# Patient Record
Sex: Female | Born: 1960 | Race: White | Hispanic: No | Marital: Married | Smoking: Never smoker
Health system: Southern US, Community
[De-identification: ages and names within clinical notes are randomized; demographics above are authoritative.]

## PROBLEM LIST (undated history)

## (undated) DIAGNOSIS — D214 Benign neoplasm of connective and other soft tissue of abdomen: Secondary | ICD-10-CM

## (undated) DIAGNOSIS — Z87442 Personal history of urinary calculi: Secondary | ICD-10-CM

## (undated) DIAGNOSIS — K219 Gastro-esophageal reflux disease without esophagitis: Secondary | ICD-10-CM

## (undated) DIAGNOSIS — G2581 Restless legs syndrome: Secondary | ICD-10-CM

## (undated) DIAGNOSIS — M1990 Unspecified osteoarthritis, unspecified site: Secondary | ICD-10-CM

## (undated) DIAGNOSIS — IMO0002 Reserved for concepts with insufficient information to code with codable children: Secondary | ICD-10-CM

## (undated) DIAGNOSIS — M797 Fibromyalgia: Secondary | ICD-10-CM

## (undated) DIAGNOSIS — E785 Hyperlipidemia, unspecified: Secondary | ICD-10-CM

## (undated) DIAGNOSIS — T7840XA Allergy, unspecified, initial encounter: Secondary | ICD-10-CM

## (undated) DIAGNOSIS — M5430 Sciatica, unspecified side: Secondary | ICD-10-CM

## (undated) DIAGNOSIS — M51369 Other intervertebral disc degeneration, lumbar region without mention of lumbar back pain or lower extremity pain: Secondary | ICD-10-CM

## (undated) DIAGNOSIS — F32A Depression, unspecified: Secondary | ICD-10-CM

## (undated) DIAGNOSIS — M5136 Other intervertebral disc degeneration, lumbar region: Secondary | ICD-10-CM

## (undated) DIAGNOSIS — F329 Major depressive disorder, single episode, unspecified: Secondary | ICD-10-CM

## (undated) DIAGNOSIS — K589 Irritable bowel syndrome without diarrhea: Secondary | ICD-10-CM

## (undated) DIAGNOSIS — F419 Anxiety disorder, unspecified: Secondary | ICD-10-CM

## (undated) HISTORY — DX: Other intervertebral disc degeneration, lumbar region: M51.36

## (undated) HISTORY — DX: Hyperlipidemia, unspecified: E78.5

## (undated) HISTORY — DX: Irritable bowel syndrome, unspecified: K58.9

## (undated) HISTORY — DX: Allergy, unspecified, initial encounter: T78.40XA

## (undated) HISTORY — DX: Other intervertebral disc degeneration, lumbar region without mention of lumbar back pain or lower extremity pain: M51.369

## (undated) HISTORY — DX: Personal history of urinary calculi: Z87.442

## (undated) HISTORY — PX: TONSILLECTOMY: SUR1361

## (undated) HISTORY — DX: Gastro-esophageal reflux disease without esophagitis: K21.9

## (undated) HISTORY — PX: MASTECTOMY: SHX3

## (undated) HISTORY — PX: OTHER SURGICAL HISTORY: SHX169

## (undated) HISTORY — DX: Fibromyalgia: M79.7

## (undated) HISTORY — DX: Benign neoplasm of connective and other soft tissue of abdomen: D21.4

## (undated) HISTORY — PX: LEFT OOPHORECTOMY: SHX1961

## (undated) HISTORY — DX: Restless legs syndrome: G25.81

## (undated) HISTORY — DX: Reserved for concepts with insufficient information to code with codable children: IMO0002

## (undated) HISTORY — DX: Sciatica, unspecified side: M54.30

## (undated) HISTORY — DX: Depression, unspecified: F32.A

## (undated) HISTORY — PX: TUBAL LIGATION: SHX77

## (undated) HISTORY — DX: Anxiety disorder, unspecified: F41.9

## (undated) HISTORY — PX: ABDOMINAL HYSTERECTOMY: SHX81

## (undated) HISTORY — DX: Major depressive disorder, single episode, unspecified: F32.9

## (undated) HISTORY — DX: Unspecified osteoarthritis, unspecified site: M19.90

---

## 1998-06-27 ENCOUNTER — Ambulatory Visit (HOSPITAL_COMMUNITY): Admission: RE | Admit: 1998-06-27 | Discharge: 1998-06-27 | Payer: Self-pay | Admitting: Gastroenterology

## 1999-04-07 ENCOUNTER — Encounter: Payer: Self-pay | Admitting: Internal Medicine

## 1999-04-07 ENCOUNTER — Ambulatory Visit (HOSPITAL_COMMUNITY): Admission: RE | Admit: 1999-04-07 | Discharge: 1999-04-07 | Payer: Self-pay | Admitting: Internal Medicine

## 1999-04-15 ENCOUNTER — Ambulatory Visit: Admission: RE | Admit: 1999-04-15 | Discharge: 1999-04-15 | Payer: Self-pay | Admitting: Internal Medicine

## 1999-09-29 ENCOUNTER — Observation Stay (HOSPITAL_COMMUNITY): Admission: EM | Admit: 1999-09-29 | Discharge: 1999-09-30 | Payer: Self-pay | Admitting: Emergency Medicine

## 1999-09-29 ENCOUNTER — Encounter: Payer: Self-pay | Admitting: Emergency Medicine

## 1999-10-06 ENCOUNTER — Other Ambulatory Visit: Admission: RE | Admit: 1999-10-06 | Discharge: 1999-10-06 | Payer: Self-pay | Admitting: Obstetrics and Gynecology

## 2000-02-22 ENCOUNTER — Emergency Department (HOSPITAL_COMMUNITY): Admission: EM | Admit: 2000-02-22 | Discharge: 2000-02-22 | Payer: Self-pay | Admitting: Emergency Medicine

## 2000-02-22 ENCOUNTER — Encounter: Payer: Self-pay | Admitting: Emergency Medicine

## 2000-03-02 ENCOUNTER — Ambulatory Visit (HOSPITAL_COMMUNITY): Admission: RE | Admit: 2000-03-02 | Discharge: 2000-03-02 | Payer: Self-pay | Admitting: Gastroenterology

## 2000-03-15 ENCOUNTER — Encounter: Payer: Self-pay | Admitting: Gastroenterology

## 2000-03-15 ENCOUNTER — Ambulatory Visit (HOSPITAL_COMMUNITY): Admission: RE | Admit: 2000-03-15 | Discharge: 2000-03-15 | Payer: Self-pay | Admitting: Gastroenterology

## 2000-06-08 ENCOUNTER — Encounter: Payer: Self-pay | Admitting: Gastroenterology

## 2000-06-08 ENCOUNTER — Ambulatory Visit (HOSPITAL_COMMUNITY): Admission: RE | Admit: 2000-06-08 | Discharge: 2000-06-08 | Payer: Self-pay | Admitting: Gastroenterology

## 2001-02-01 ENCOUNTER — Other Ambulatory Visit: Admission: RE | Admit: 2001-02-01 | Discharge: 2001-02-01 | Payer: Self-pay | Admitting: Obstetrics and Gynecology

## 2002-01-28 ENCOUNTER — Emergency Department (HOSPITAL_COMMUNITY): Admission: EM | Admit: 2002-01-28 | Discharge: 2002-01-28 | Payer: Self-pay | Admitting: Emergency Medicine

## 2002-01-28 ENCOUNTER — Encounter: Payer: Self-pay | Admitting: Emergency Medicine

## 2002-02-06 ENCOUNTER — Other Ambulatory Visit: Admission: RE | Admit: 2002-02-06 | Discharge: 2002-02-06 | Payer: Self-pay | Admitting: Obstetrics and Gynecology

## 2003-07-09 ENCOUNTER — Ambulatory Visit (HOSPITAL_COMMUNITY): Admission: RE | Admit: 2003-07-09 | Discharge: 2003-07-09 | Payer: Self-pay | Admitting: Gastroenterology

## 2003-07-10 ENCOUNTER — Other Ambulatory Visit: Admission: RE | Admit: 2003-07-10 | Discharge: 2003-07-10 | Payer: Self-pay | Admitting: Obstetrics and Gynecology

## 2003-07-11 ENCOUNTER — Ambulatory Visit (HOSPITAL_BASED_OUTPATIENT_CLINIC_OR_DEPARTMENT_OTHER): Admission: RE | Admit: 2003-07-11 | Discharge: 2003-07-11 | Payer: Self-pay | Admitting: Internal Medicine

## 2005-06-22 ENCOUNTER — Other Ambulatory Visit: Admission: RE | Admit: 2005-06-22 | Discharge: 2005-06-22 | Payer: Self-pay | Admitting: Obstetrics and Gynecology

## 2005-10-25 ENCOUNTER — Ambulatory Visit: Payer: Self-pay | Admitting: Internal Medicine

## 2005-10-27 ENCOUNTER — Ambulatory Visit: Payer: Self-pay | Admitting: Internal Medicine

## 2007-11-30 LAB — CONVERTED CEMR LAB: Pap Smear: NORMAL

## 2007-12-18 ENCOUNTER — Encounter: Admission: RE | Admit: 2007-12-18 | Discharge: 2007-12-18 | Payer: Self-pay | Admitting: Obstetrics and Gynecology

## 2007-12-19 ENCOUNTER — Other Ambulatory Visit: Admission: RE | Admit: 2007-12-19 | Discharge: 2007-12-19 | Payer: Self-pay | Admitting: Obstetrics and Gynecology

## 2008-04-26 ENCOUNTER — Encounter: Payer: Self-pay | Admitting: Endocrinology

## 2008-08-06 ENCOUNTER — Ambulatory Visit: Payer: Self-pay | Admitting: Internal Medicine

## 2008-08-06 DIAGNOSIS — K589 Irritable bowel syndrome without diarrhea: Secondary | ICD-10-CM | POA: Insufficient documentation

## 2008-08-06 DIAGNOSIS — M797 Fibromyalgia: Secondary | ICD-10-CM | POA: Insufficient documentation

## 2008-08-06 DIAGNOSIS — G4733 Obstructive sleep apnea (adult) (pediatric): Secondary | ICD-10-CM | POA: Insufficient documentation

## 2008-08-06 DIAGNOSIS — J309 Allergic rhinitis, unspecified: Secondary | ICD-10-CM | POA: Insufficient documentation

## 2008-08-06 DIAGNOSIS — G2581 Restless legs syndrome: Secondary | ICD-10-CM | POA: Insufficient documentation

## 2008-08-06 DIAGNOSIS — Z87442 Personal history of urinary calculi: Secondary | ICD-10-CM | POA: Insufficient documentation

## 2008-08-06 DIAGNOSIS — N390 Urinary tract infection, site not specified: Secondary | ICD-10-CM | POA: Insufficient documentation

## 2008-08-06 DIAGNOSIS — F329 Major depressive disorder, single episode, unspecified: Secondary | ICD-10-CM | POA: Insufficient documentation

## 2008-08-06 DIAGNOSIS — K219 Gastro-esophageal reflux disease without esophagitis: Secondary | ICD-10-CM | POA: Insufficient documentation

## 2008-08-06 DIAGNOSIS — F411 Generalized anxiety disorder: Secondary | ICD-10-CM | POA: Insufficient documentation

## 2008-08-06 DIAGNOSIS — E785 Hyperlipidemia, unspecified: Secondary | ICD-10-CM | POA: Insufficient documentation

## 2008-08-06 LAB — CONVERTED CEMR LAB
ALT: 28 units/L (ref 0–35)
AST: 24 units/L (ref 0–37)
Albumin: 4.1 g/dL (ref 3.5–5.2)
Alkaline Phosphatase: 66 units/L (ref 39–117)
BUN: 17 mg/dL (ref 6–23)
Basophils Absolute: 0 10*3/uL (ref 0.0–0.1)
Basophils Relative: 0.6 % (ref 0.0–3.0)
Bilirubin Urine: NEGATIVE
Bilirubin, Direct: 0.1 mg/dL (ref 0.0–0.3)
CO2: 31 meq/L (ref 19–32)
Calcium: 9.6 mg/dL (ref 8.4–10.5)
Chloride: 103 meq/L (ref 96–112)
Cholesterol: 249 mg/dL (ref 0–200)
Creatinine, Ser: 0.6 mg/dL (ref 0.4–1.2)
Crystals: NEGATIVE
Direct LDL: 149.6 mg/dL
Eosinophils Absolute: 0.1 10*3/uL (ref 0.0–0.7)
Eosinophils Relative: 1.5 % (ref 0.0–5.0)
GFR calc Af Amer: 137 mL/min
GFR calc non Af Amer: 113 mL/min
Glucose, Bld: 101 mg/dL — ABNORMAL HIGH (ref 70–99)
HCT: 37.7 % (ref 36.0–46.0)
HDL: 36.9 mg/dL — ABNORMAL LOW (ref >39.0)
Hemoglobin: 13.3 g/dL (ref 12.0–15.0)
Lymphocytes Relative: 38.1 % (ref 12.0–46.0)
MCHC: 35.3 g/dL (ref 30.0–36.0)
MCV: 91 fL (ref 78.0–100.0)
Monocytes Absolute: 0.8 10*3/uL (ref 0.1–1.0)
Monocytes Relative: 9.4 % (ref 3.0–12.0)
Mucus, UA: NEGATIVE
Neutro Abs: 4.1 10*3/uL (ref 1.4–7.7)
Neutrophils Relative %: 50.4 % (ref 43.0–77.0)
Nitrite: POSITIVE — AB
Platelets: 275 10*3/uL (ref 150–400)
Potassium: 3.5 meq/L (ref 3.5–5.1)
RBC: 4.14 M/uL (ref 3.87–5.11)
RDW: 11.8 % (ref 11.5–14.6)
Sodium: 142 meq/L (ref 135–145)
Specific Gravity, Urine: 1.02 (ref 1.000–1.03)
TSH: 0.76 microintl units/mL (ref 0.35–5.50)
Total Bilirubin: 0.8 mg/dL (ref 0.3–1.2)
Total CHOL/HDL Ratio: 6.7
Total Protein, Urine: 100 mg/dL — AB
Total Protein: 7.2 g/dL (ref 6.0–8.3)
Triglycerides: 218 mg/dL (ref 0–149)
Urine Glucose: 100 mg/dL — AB
Urobilinogen, UA: 4 — AB (ref 0.0–1.0)
VLDL: 44 mg/dL — ABNORMAL HIGH (ref 0–40)
WBC: 8 10*3/uL (ref 4.5–10.5)
pH: 6.5 (ref 5.0–8.0)

## 2008-08-08 LAB — CONVERTED CEMR LAB: Vit D, 1,25-Dihydroxy: 22 — ABNORMAL LOW (ref 30–89)

## 2008-09-10 ENCOUNTER — Encounter: Payer: Self-pay | Admitting: Internal Medicine

## 2008-10-16 ENCOUNTER — Encounter: Payer: Self-pay | Admitting: Internal Medicine

## 2009-03-18 ENCOUNTER — Encounter: Payer: Self-pay | Admitting: Internal Medicine

## 2009-05-05 ENCOUNTER — Telehealth (INDEPENDENT_AMBULATORY_CARE_PROVIDER_SITE_OTHER): Payer: Self-pay | Admitting: *Deleted

## 2009-08-20 ENCOUNTER — Ambulatory Visit: Payer: Self-pay | Admitting: Internal Medicine

## 2009-08-20 DIAGNOSIS — J329 Chronic sinusitis, unspecified: Secondary | ICD-10-CM | POA: Insufficient documentation

## 2009-08-21 LAB — CONVERTED CEMR LAB
ALT: 52 units/L — ABNORMAL HIGH (ref 0–35)
AST: 36 units/L (ref 0–37)
Albumin: 4.1 g/dL (ref 3.5–5.2)
Alkaline Phosphatase: 68 units/L (ref 39–117)
BUN: 14 mg/dL (ref 6–23)
Basophils Absolute: 0 10*3/uL (ref 0.0–0.1)
Basophils Relative: 0.4 % (ref 0.0–3.0)
Bilirubin Urine: NEGATIVE
Bilirubin, Direct: 0.1 mg/dL (ref 0.0–0.3)
CO2: 30 meq/L (ref 19–32)
Calcium: 9.3 mg/dL (ref 8.4–10.5)
Chloride: 107 meq/L (ref 96–112)
Cholesterol: 212 mg/dL — ABNORMAL HIGH (ref 0–200)
Creatinine, Ser: 0.6 mg/dL (ref 0.4–1.2)
Direct LDL: 149.9 mg/dL
Eosinophils Absolute: 0.2 10*3/uL (ref 0.0–0.7)
Eosinophils Relative: 2.8 % (ref 0.0–5.0)
GFR calc non Af Amer: 112.82 mL/min (ref >60)
Glucose, Bld: 95 mg/dL (ref 70–99)
HCT: 38.8 % (ref 36.0–46.0)
HDL: 42.1 mg/dL (ref >39.00)
Hemoglobin: 13.5 g/dL (ref 12.0–15.0)
Ketones, ur: NEGATIVE mg/dL
Leukocytes, UA: NEGATIVE
Lymphocytes Relative: 38.3 % (ref 12.0–46.0)
Lymphs Abs: 2.7 10*3/uL (ref 0.7–4.0)
MCHC: 34.8 g/dL (ref 30.0–36.0)
MCV: 90.8 fL (ref 78.0–100.0)
Monocytes Absolute: 0.8 10*3/uL (ref 0.1–1.0)
Monocytes Relative: 10.7 % (ref 3.0–12.0)
Neutro Abs: 3.4 10*3/uL (ref 1.4–7.7)
Neutrophils Relative %: 47.8 % (ref 43.0–77.0)
Nitrite: NEGATIVE
Platelets: 241 10*3/uL (ref 150.0–400.0)
Potassium: 3.9 meq/L (ref 3.5–5.1)
RBC: 4.27 M/uL (ref 3.87–5.11)
RDW: 11.6 % (ref 11.5–14.6)
Sodium: 141 meq/L (ref 135–145)
Specific Gravity, Urine: 1.02 (ref 1.000–1.030)
TSH: 0.54 microintl units/mL (ref 0.35–5.50)
Total Bilirubin: 0.7 mg/dL (ref 0.3–1.2)
Total CHOL/HDL Ratio: 5
Total Protein, Urine: NEGATIVE mg/dL
Total Protein: 7.5 g/dL (ref 6.0–8.3)
Triglycerides: 173 mg/dL — ABNORMAL HIGH (ref 0.0–149.0)
Urine Glucose: NEGATIVE mg/dL
Urobilinogen, UA: 0.2 (ref 0.0–1.0)
VLDL: 34.6 mg/dL (ref 0.0–40.0)
WBC: 7.1 10*3/uL (ref 4.5–10.5)
pH: 6.5 (ref 5.0–8.0)

## 2010-01-28 ENCOUNTER — Encounter: Payer: Self-pay | Admitting: Internal Medicine

## 2010-02-23 ENCOUNTER — Ambulatory Visit: Payer: Self-pay | Admitting: Internal Medicine

## 2010-02-23 DIAGNOSIS — J019 Acute sinusitis, unspecified: Secondary | ICD-10-CM | POA: Insufficient documentation

## 2010-03-16 ENCOUNTER — Ambulatory Visit: Payer: Self-pay | Admitting: Internal Medicine

## 2010-03-16 DIAGNOSIS — R197 Diarrhea, unspecified: Secondary | ICD-10-CM | POA: Insufficient documentation

## 2010-03-16 DIAGNOSIS — R109 Unspecified abdominal pain: Secondary | ICD-10-CM | POA: Insufficient documentation

## 2010-03-16 LAB — CONVERTED CEMR LAB
Basophils Absolute: 0 10*3/uL (ref 0.0–0.1)
Basophils Relative: 0.3 % (ref 0.0–3.0)
Bilirubin Urine: NEGATIVE
Eosinophils Absolute: 0 10*3/uL (ref 0.0–0.7)
Eosinophils Relative: 0.7 % (ref 0.0–5.0)
HCT: 37.7 % (ref 36.0–46.0)
Hemoglobin: 13 g/dL (ref 12.0–15.0)
Ketones, ur: NEGATIVE mg/dL
Leukocytes, UA: NEGATIVE
Lymphocytes Relative: 34.3 % (ref 12.0–46.0)
Lymphs Abs: 2.1 10*3/uL (ref 0.7–4.0)
MCHC: 34.5 g/dL (ref 30.0–36.0)
MCV: 90.5 fL (ref 78.0–100.0)
Monocytes Absolute: 0.6 10*3/uL (ref 0.1–1.0)
Monocytes Relative: 9.3 % (ref 3.0–12.0)
Neutro Abs: 3.3 10*3/uL (ref 1.4–7.7)
Neutrophils Relative %: 55.4 % (ref 43.0–77.0)
Nitrite: NEGATIVE
Platelets: 216 10*3/uL (ref 150.0–400.0)
RBC: 4.16 M/uL (ref 3.87–5.11)
RDW: 12.4 % (ref 11.5–14.6)
Specific Gravity, Urine: 1.015 (ref 1.000–1.030)
Total Protein, Urine: NEGATIVE mg/dL
Urine Glucose: NEGATIVE mg/dL
Urobilinogen, UA: 0.2 (ref 0.0–1.0)
WBC: 6 10*3/uL (ref 4.5–10.5)
pH: 6 (ref 5.0–8.0)

## 2010-03-17 ENCOUNTER — Encounter: Payer: Self-pay | Admitting: Internal Medicine

## 2010-04-01 ENCOUNTER — Emergency Department (HOSPITAL_BASED_OUTPATIENT_CLINIC_OR_DEPARTMENT_OTHER): Admission: EM | Admit: 2010-04-01 | Discharge: 2010-04-01 | Payer: Self-pay | Admitting: Emergency Medicine

## 2010-04-01 ENCOUNTER — Ambulatory Visit: Payer: Self-pay | Admitting: Diagnostic Radiology

## 2010-04-20 ENCOUNTER — Emergency Department (HOSPITAL_COMMUNITY): Admission: EM | Admit: 2010-04-20 | Discharge: 2010-04-20 | Payer: Self-pay | Admitting: Emergency Medicine

## 2010-04-29 ENCOUNTER — Encounter: Payer: Self-pay | Admitting: Internal Medicine

## 2010-08-11 ENCOUNTER — Ambulatory Visit: Payer: Self-pay | Admitting: Internal Medicine

## 2010-08-25 ENCOUNTER — Encounter: Payer: Self-pay | Admitting: Internal Medicine

## 2010-08-25 ENCOUNTER — Encounter (INDEPENDENT_AMBULATORY_CARE_PROVIDER_SITE_OTHER): Payer: Self-pay | Admitting: *Deleted

## 2010-08-25 ENCOUNTER — Ambulatory Visit: Payer: Self-pay | Admitting: Internal Medicine

## 2010-08-25 DIAGNOSIS — R21 Rash and other nonspecific skin eruption: Secondary | ICD-10-CM | POA: Insufficient documentation

## 2010-08-26 LAB — CONVERTED CEMR LAB
ALT: 29 units/L (ref 0–35)
AST: 24 units/L (ref 0–37)
Albumin: 4.1 g/dL (ref 3.5–5.2)
Alkaline Phosphatase: 78 units/L (ref 39–117)
BUN: 19 mg/dL (ref 6–23)
Basophils Absolute: 0 10*3/uL (ref 0.0–0.1)
Basophils Relative: 0.5 % (ref 0.0–3.0)
Bilirubin Urine: NEGATIVE
Bilirubin, Direct: 0.1 mg/dL (ref 0.0–0.3)
CO2: 30 meq/L (ref 19–32)
Calcium: 9.1 mg/dL (ref 8.4–10.5)
Chloride: 108 meq/L (ref 96–112)
Cholesterol: 221 mg/dL — ABNORMAL HIGH (ref 0–200)
Creatinine, Ser: 0.6 mg/dL (ref 0.4–1.2)
Direct LDL: 154.5 mg/dL
Eosinophils Absolute: 0.1 10*3/uL (ref 0.0–0.7)
Eosinophils Relative: 2 % (ref 0.0–5.0)
GFR calc non Af Amer: 119.2 mL/min (ref >60)
Glucose, Bld: 87 mg/dL (ref 70–99)
HCT: 35.8 % — ABNORMAL LOW (ref 36.0–46.0)
HDL: 51.3 mg/dL (ref >39.00)
Hemoglobin: 12.3 g/dL (ref 12.0–15.0)
Ketones, ur: NEGATIVE mg/dL
Lymphocytes Relative: 42.1 % (ref 12.0–46.0)
Lymphs Abs: 2.7 10*3/uL (ref 0.7–4.0)
MCHC: 34.2 g/dL (ref 30.0–36.0)
MCV: 90.9 fL (ref 78.0–100.0)
Monocytes Absolute: 0.5 10*3/uL (ref 0.1–1.0)
Monocytes Relative: 7 % (ref 3.0–12.0)
Neutro Abs: 3.1 10*3/uL (ref 1.4–7.7)
Neutrophils Relative %: 48.4 % (ref 43.0–77.0)
Nitrite: NEGATIVE
Platelets: 243 10*3/uL (ref 150.0–400.0)
Potassium: 4.6 meq/L (ref 3.5–5.1)
RBC: 3.94 M/uL (ref 3.87–5.11)
RDW: 13 % (ref 11.5–14.6)
Sodium: 143 meq/L (ref 135–145)
Specific Gravity, Urine: 1.02 (ref 1.000–1.030)
TSH: 0.8 microintl units/mL (ref 0.35–5.50)
Total Bilirubin: 0.4 mg/dL (ref 0.3–1.2)
Total CHOL/HDL Ratio: 4
Total Protein, Urine: NEGATIVE mg/dL
Total Protein: 7.2 g/dL (ref 6.0–8.3)
Triglycerides: 58 mg/dL (ref 0.0–149.0)
Urine Glucose: NEGATIVE mg/dL
Urobilinogen, UA: 0.2 (ref 0.0–1.0)
VLDL: 11.6 mg/dL (ref 0.0–40.0)
WBC: 6.4 10*3/uL (ref 4.5–10.5)
pH: 7 (ref 5.0–8.0)

## 2010-09-23 ENCOUNTER — Ambulatory Visit: Payer: Self-pay | Admitting: Internal Medicine

## 2010-12-31 NOTE — Letter (Signed)
Summary: [facility] [place] [facility]  [facility] [place] [facility]   Imported By: Lennie Odor 08/27/2010 10:38:35  _____________________________________________________________________  External Attachment:    Type:   Image     Comment:   External Document

## 2010-12-31 NOTE — Assessment & Plan Note (Signed)
Summary: CPX /[facility]  #   Vital Signs:  Patient profile:   50 year old female Height:      63.5 inches Weight:      207.13 pounds BMI:     36.25 O2 Sat:      99 % on Room air Temp:     97.3 degrees F oral Pulse rate:   79 / minute BP sitting:   100 / 70  (left arm) Cuff size:   large  Vitals Entered By: Zella Ball Ewing CMA Duncan Dull) (August 25, 2010 1:16 PM)  O2 Flow:  Room air  Preventive Care Screening  Colonoscopy:    Date:  04/29/2010    Next Due:  04/2015    Results:  Diverticulosis   CC: Adult Physical/RE   CC:  Adult Physical/RE.  History of Present Illness: here for wellness, overall doing well, Pt denies CP, worsening sob, doe, wheezing, orthopnea, pnd, worsening LE edema, palps, dizziness or syncope  Pt denies new neuro symptoms such as headache, facial or extremity weakness  Pt denies polydipsia, polyuria, No fever, wt loss, night sweats, loss of appetite or other constitutional symptoms   Preventive Screening-Counseling & Management      Drug Use:  no.    Problems Prior to Update: 1)  Rash-nonvesicular  (ICD-782.1) 2)  Rash-nonvesicular  (ICD-782.1) 3)  Sinusitis- Acute-nos  (ICD-461.9) 4)  Diarrhea  (ICD-787.91) 5)  Abdominal Pain, Lower  (ICD-789.09) 6)  Sinusitis- Acute-nos  (ICD-461.9) 7)  Sinusitis  (ICD-473.9) 8)  Preventive Health Care  (ICD-V70.0) 9)  Uti  (ICD-599.0) 10)  Preventive Health Care  (ICD-V70.0) 11)  Obstructive Sleep Apnea  (ICD-327.23) 12)  Depression  (ICD-311) 13)  Anxiety  (ICD-300.00) 14)  Ibs  (ICD-564.1) 15)  Allergic Rhinitis  (ICD-477.9) 16)  Nephrolithiasis, Hx of  (ICD-V13.01) 17)  Hyperlipidemia  (ICD-272.4) 18)  Gerd  (ICD-530.81) 19)  Restless Legs Syndrome  (ICD-333.94) 20)  Fibromyalgia  (ICD-729.1)  Medications Prior to Update: 1)  Cymbalta 60 Mg Cpep (Duloxetine Hcl) .Marland Kitchen.. 1 Daily 2)  Nexium 20 Mg Cpdr (Esomeprazole Magnesium) .Marland Kitchen.. 1 Daily 3)  Nasacort Aq 55 Mcg/act Aers (Triamcinolone Acetonide(Nasal)) .Marland Kitchen.. 1  Spray Each Nostril Daily 4)  Veramyst 27.5 Mcg/spray Susp (Fluticasone Furoate) .Marland Kitchen.. 1 Spray Each Nostril Daily 5)  Ciprofloxacin Hcl 500 Mg Tabs (Ciprofloxacin Hcl) .Marland Kitchen.. 1 By Mouth Two Times A Day 6)  Vitamin D3 10000 Unit Caps (Cholecalciferol) .Marland Kitchen.. 1 By Mouth Two Times A Day 7)  Calcium Carbonate 600 Mg Tabs (Calcium Carbonate) .Marland Kitchen.. 1 By Mouth Once Daily 8)  Promethazine Hcl 25 Mg Tabs (Promethazine Hcl) .Marland Kitchen.. 1po Q 6 Hrs As Needed 9)  Lomotil 2.5-0.025 Mg Tabs (Diphenoxylate-Atropine) .Marland Kitchen.. 1po As Needed Loose Stool  -  Max 8 Tabs Per Day 10)  Crestor 10 Mg Tabs (Rosuvastatin Calcium) .Marland Kitchen.. 1po Once Daily 11)  Azithromycin 250 Mg Tabs (Azithromycin) .... 2po Qd For 1 Day, Then 1po Qd For 4days, Then Stop 12)  Tessalon Perles 100 Mg Caps (Benzonatate) .Marland Kitchen.. 1-2 By Mouth Three Times A Day As Needed For Cough  Current Medications (verified): 1)  Cymbalta 60 Mg Cpep (Duloxetine Hcl) .Marland Kitchen.. 1 Daily 2)  Nexium 20 Mg Cpdr (Esomeprazole Magnesium) .Marland Kitchen.. 1 Daily 3)  Nasacort Aq 55 Mcg/act Aers (Triamcinolone Acetonide(Nasal)) .Marland Kitchen.. 1 Spray Each Nostril Daily 4)  Ciprofloxacin Hcl 500 Mg Tabs (Ciprofloxacin Hcl) .Marland Kitchen.. 1 By Mouth Two Times A Day 5)  Vitamin D3 10000 Unit Caps (Cholecalciferol) .Marland Kitchen.. 1 By Mouth Two Times A  Day 6)  Calcium Carbonate 600 Mg Tabs (Calcium Carbonate) .Marland Kitchen.. 1 By Mouth Once Daily 7)  Promethazine Hcl 25 Mg Tabs (Promethazine Hcl) .Marland Kitchen.. 1po Q 6 Hrs As Needed 8)  Lomotil 2.5-0.025 Mg Tabs (Diphenoxylate-Atropine) .Marland Kitchen.. 1po As Needed Loose Stool  -  Max 8 Tabs Per Day 9)  Crestor 10 Mg Tabs (Rosuvastatin Calcium) .Marland Kitchen.. 1po Once Daily 10)  Patanol 0.1 % Soln (Olopatadine Hcl) .... Use Asd 11)  Triamcinolone Acetonide 0.1 % Crea (Triamcinolone Acetonide) .... Use Asd Two Times A Day As Needed  Allergies (verified): 1)  ! Novocain (Procaine Hcl) 2)  ! Vicodin (Hydrocodone-Acetaminophen) 3)  ! 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 Oophorectomy - left Tonsillectomy Tubal ligation Caesarean section s/p skin cancer -"pre-melanoma" - right ear s/p0 basket extraction renal stone 1990's  Social History: Reviewed history from 08/06/2008 and no changes required. 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         all otherwise negative per pt -  except does have mild contact derm type rash to the ant lower legs after walking in the woods  Physical Exam  General:  alert and overweight-appearing.   Head:  normocephalic and atraumatic.   Eyes:  vision grossly intact, pupils equal, and pupils round.   Ears:  R ear normal and  L ear normal.   Nose:  no external deformity and no nasal discharge.   Mouth:  no gingival abnormalities and pharynx pink and moist.   Neck:  supple and no masses.   Lungs:  normal respiratory effort and normal breath sounds.  normal respiratory effort and normal breath sounds.   Heart:  normal rate and regular rhythm.  normal rate and regular rhythm.   Abdomen:  soft, non-tender, and normal bowel sounds.  soft, non-tender, and normal bowel sounds.   Msk:  no joint tenderness and no joint swelling.  no joint tenderness and no joint swelling.   Extremities:  no edema, no erythema  Neurologic:  cranial nerves II-XII intact and strength normal in all extremities.  cranial nerves II-XII intact and strength normal in all extremities.   Skin:  mild contact derm type linear rash to the ant lower legs Psych:  not depressed appearing and moderately anxious.  not depressed appearing.     Impression & Recommendations:  Problem # 1:  Preventive Health Care (ICD-V70.0)  Overall doing well, age appropriate education and counseling updated and referral for appropriate preventive services done unless declined, immunizations up to date or declined, diet counseling done if overweight, urged to quit smoking if smokes , most recent labs reviewed and current ordered if appropriate, ecg reviewed or declined (interpretation per ECG scanned in the EMR if done); information regarding Medicare Prevention requirements given if appropriate; speciality referrals updated as appropriate  Orders: EKG w/ Interpretation (93000)  Problem # 2:  RASH-NONVESICULAR (ICD-782.1)  Her updated medication list for this problem includes:    Triamcinolone Acetonide 0.1 % Crea (Triamcinolone acetonide) ..... Use asd two times a day as needed treat as above, f/u any worsening signs or symptoms   Her updated medication list for this problem includes:    Triamcinolone Acetonide 0.1 % Crea (Triamcinolone acetonide) ..... Use asd two times  a day as needed  Complete Medication List: 1)  Cymbalta 60 Mg Cpep (Duloxetine hcl) .Marland Kitchen.. 1 daily 2)  Nexium 20 Mg Cpdr (Esomeprazole magnesium) .Marland Kitchen.. 1 daily 3)  Nasacort Aq 55 Mcg/act Aers (Triamcinolone acetonide(nasal)) .Marland Kitchen.. 1 spray each nostril daily 4)  Ciprofloxacin Hcl 500 Mg Tabs (Ciprofloxacin hcl) .Marland Kitchen.. 1 by mouth two times a day 5)  Vitamin D3 10000 Unit Caps (Cholecalciferol) .Marland Kitchen.. 1 by mouth two times a day 6)  Calcium Carbonate 600 Mg Tabs (Calcium carbonate) .Marland Kitchen.. 1 by mouth once daily 7)  Promethazine Hcl 25 Mg Tabs (Promethazine hcl) .Marland Kitchen.. 1po q 6 hrs as needed 8)  Lomotil 2.5-0.025 Mg Tabs (Diphenoxylate-atropine) .Marland Kitchen.. 1po as needed loose stool  -  max 8 tabs per day 9)  Crestor 10 Mg Tabs (Rosuvastatin calcium) .Marland Kitchen.. 1po once daily 10)  Patanol 0.1 % Soln (Olopatadine hcl) .... Use asd 11)  Triamcinolone Acetonide 0.1 % Crea (Triamcinolone acetonide) .... Use asd two times a day as needed  Other Orders: Admin 1st Vaccine (16109) Flu Vaccine 29yrs + (60454) Tdap => 28yrs IM (09811) Admin of Any Addtl Vaccine (91478)  Patient Instructions: 1)  you had the flu shot today, and the tetanus shot today 2)  Your EKG was good today 3)  Please call for your yearly pap smear and mammogram 4)  Please call the number on the [facility] Card for results of your testing  5)  Please take all new medications as prescribed - the cream 6)  Continue all previous medications as before this visit  7)  Please schedule a follow-up appointment in 1 year, or sooner if needed Prescriptions: TRIAMCINOLONE ACETONIDE 0.1 % CREA (TRIAMCINOLONE ACETONIDE) use asd two times a day as needed  #1 x 1   Entered and Authorized by:   Corwin Levins MD   Signed by:   Corwin Levins MD on 08/25/2010   Method used:   Print then Give to Patient   RxID:   [id number]    Flu Vaccine Consent Questions     Do you have a history of severe allergic reactions to this vaccine? no    Any prior history of allergic reactions  to egg and/or gelatin? no    Do you have a sensitivity to the preservative Thimersol? no    Do you have a past history of Guillan-Barre Syndrome? no    Do you currently have an acute febrile illness? no    Have you ever had a severe reaction to latex? no    Vaccine information given and explained to patient? yes    Are you currently pregnant? no  Lot Number:[id number]   Exp Date:05/29/2011   Site Given  Left Deltoid IMflu   Immunizations Administered:  Tetanus Vaccine:    Vaccine Type: Tdap    Site: right deltoid    Mfr: GlaxoSmithKline    Dose: 0.5 ml    Route: IM    Given by: Zella Ball Ewing CMA (AAMA)    Exp. Date: 09/17/2012    Lot #: [id number]    VIS given: 10/16/08 version given August 25, 2010.

## 2010-12-31 NOTE — Letter (Signed)
Summary: [facility]  [facility]   Imported By: Sherian Rein 03/28/2009 11:57:19  _____________________________________________________________________  External Attachment:    Type:   Image     Comment:   External Document

## 2010-12-31 NOTE — Letter (Signed)
Summary: Fibromyalgia; depression/[facility]  Fibromyalgia; depression/[facility]   Imported By: Lester Pangburn 11/07/2008 08:03:03  _____________________________________________________________________  External Attachment:    Type:   Image     Comment:   External Document

## 2010-12-31 NOTE — Consult Note (Signed)
Summary: [facility]   Imported By: Esmeralda Links D'jimraou 05/08/2008 14:57:01  _____________________________________________________________________  External Attachment:    Type:   Image     Comment:   External Document

## 2010-12-31 NOTE — Assessment & Plan Note (Signed)
Summary: RIGHT EAR PAIN, ?INFECTION/CD   Vital Signs:  Patient profile:   50 year old female Height:      63 inches Weight:      212 pounds BMI:     37.69 O2 Sat:      96 % Temp:     97.6 degrees F oral Pulse rate:   88 / minute BP sitting:   118 / 86  (left arm) Cuff size:   regular  Vitals Entered By: Windell Norfolk (August 20, 2009 3:04 PM)  Preventive Care Screening  Colonoscopy:    Date:  08/20/2009    Results:  declined  CC: right ear pain x 2-3 days   CC:  right ear pain x 2-3 days.  History of Present Illness: here incidently wtih 3 days onset facial pain, pressure, fever and greenish d/c;  Pt denies CP, sob, doe, wheezing, orthopnea, pnd, worsening LE edema, palps, dizziness or syncope  Pt denies new neuro symptoms such as headache, facial or extremity weakness     Problems Prior to Update: 1)  Sinusitis  (ICD-473.9) 2)  Preventive Health Care  (ICD-V70.0) 3)  Uti  (ICD-599.0) 4)  Preventive Health Care  (ICD-V70.0) 5)  Obstructive Sleep Apnea  (ICD-327.23) 6)  Depression  (ICD-311) 7)  Anxiety  (ICD-300.00) 8)  Ibs  (ICD-564.1) 9)  Allergic Rhinitis  (ICD-477.9) 10)  Nephrolithiasis, Hx of  (ICD-V13.01) 11)  Hyperlipidemia  (ICD-272.4) 12)  Gerd  (ICD-530.81) 13)  Restless Legs Syndrome  (ICD-333.94) 14)  Fibromyalgia  (ICD-729.1)  Medications Prior to Update: 1)  Cymbalta 60 Mg Cpep (Duloxetine Hcl) .Marland Kitchen.. 1 Daily 2)  Nexium 20 Mg Cpdr (Esomeprazole Magnesium) .Marland Kitchen.. 1 Daily 3)  Nasacort Aq 55 Mcg/act Aers (Triamcinolone Acetonide(Nasal)) .Marland Kitchen.. 1 Spray Each Nostril Daily 4)  Veramyst 27.5 Mcg/spray Susp (Fluticasone Furoate) .Marland Kitchen.. 1 Spray Each Nostril Daily 5)  Ciprofloxacin Hcl 500 Mg Tabs (Ciprofloxacin Hcl) .Marland Kitchen.. 1 By Mouth Two Times A Day  Current Medications (verified): 1)  Cymbalta 60 Mg Cpep (Duloxetine Hcl) .Marland Kitchen.. 1 Daily 2)  Nexium 20 Mg Cpdr (Esomeprazole Magnesium) .Marland Kitchen.. 1 Daily 3)  Nasacort Aq 55 Mcg/act Aers (Triamcinolone Acetonide(Nasal)) .Marland Kitchen..  1 Spray Each Nostril Daily 4)  Veramyst 27.5 Mcg/spray Susp (Fluticasone Furoate) .Marland Kitchen.. 1 Spray Each Nostril Daily 5)  Ciprofloxacin Hcl 500 Mg Tabs (Ciprofloxacin Hcl) .Marland Kitchen.. 1 By Mouth Two Times A Day 6)  Vitamin D3 10000 Unit Caps (Cholecalciferol) .Marland Kitchen.. 1 By Mouth Two Times A Day 7)  Calcium Carbonate 600 Mg Tabs (Calcium Carbonate) .Marland Kitchen.. 1 By Mouth Once Daily 8)  Augmentin 875-125 Mg Tabs (Amoxicillin-Pot Clavulanate) .Marland Kitchen.. 1 By Mouth Two Times A Day 9)  Sudahist 12-120 Mg Xr12h-Tab (Chlorpheniramine-Pseudoeph) .Marland Kitchen.. 1po Two Times A Day As Needed  Allergies (verified): 1)  ! Novocain (Procaine Hcl) 2)  ! Vicodin (Hydrocodone-Acetaminophen) 3)  ! 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 Hyperlipidemia Nephrolithiasis, hx of Allergic rhinitis Anxiety Depression OSA - mild  Past Surgical History: Last updated: 08/06/2008 Oophorectomy - left Tonsillectomy Tubal ligation Caesarean section s/p skin cancer -"pre-melanoma" - right ear s/p0 basket extraction renal stone 1990's  Family History: Last updated: 08/06/2008 aunt and grandfather with asthma father with heart disease, elevated cholesterol mother with gastric bypass, HTN multiple cancers including - skin, breast, colon - grandmother, uterine, renal  Social History: Last updated: 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 Alcohol use-yes - social 4 children - 2 biological   Risk Factors: Smoking Status: quit (08/06/2008)  Review of Systems  The patient denies anorexia, fever, weight loss, weight gain, vision loss, decreased hearing, hoarseness, chest pain, syncope, dyspnea on exertion, peripheral edema, prolonged cough, headaches, hemoptysis, abdominal pain, melena, hematochezia, severe indigestion/heartburn, hematuria,  incontinence, muscle weakness, suspicious skin lesions, transient blindness, difficulty walking, depression, unusual weight change, abnormal bleeding, enlarged lymph nodes, angioedema, and breast masses.         all otherwise negative   Physical Exam  General:  alert and overweight-appearing. , mild ill  Head:  normocephalic and atraumatic.   Eyes:  vision grossly intact, pupils equal, and pupils round.   Ears:  bilat tm's red, sinus tender bilat Nose:  nasal dischargemucosal pallor and mucosal erythema.   Mouth:  pharyngeal erythema and fair dentition.   Neck:  supple and cervical lymphadenopathy.   Lungs:  normal respiratory effort and normal breath sounds.   Heart:  normal rate and regular rhythm.   Abdomen:  soft, non-tender, and normal bowel sounds.   Msk:  no joint tenderness and no joint swelling.   Extremities:  no edema, no erythema  Neurologic:  cranial nerves II-XII intact and strength normal in all extremities.     Impression & Recommendations:  Problem # 1:  PREVENTIVE HEALTH CARE (ICD-V70.0)  Overall doing well, up to date, counseled on routine health concerns for screening and prevention, immunizations up to date or declined, labs ordered  Orders: TLB-BMP (Basic Metabolic Panel-BMET) (80048-METABOL) TLB-CBC Platelet - w/Differential (85025-CBCD) TLB-Hepatic/Liver Function Pnl (80076-HEPATIC) TLB-Lipid Panel (80061-LIPID) TLB-TSH (Thyroid Stimulating Hormone) (84443-TSH) TLB-Udip ONLY (81003-UDIP)  Problem # 2:  SINUSITIS (ICD-473.9)  Her updated medication list for this problem includes:    Nasacort Aq 55 Mcg/act Aers (Triamcinolone acetonide(nasal)) .Marland Kitchen... 1 spray each nostril daily    Veramyst 27.5 Mcg/spray Susp (Fluticasone furoate) .Marland Kitchen... 1 spray each nostril daily    Ciprofloxacin Hcl 500 Mg Tabs (Ciprofloxacin hcl) .Marland Kitchen... 1 by mouth two times a day    Augmentin 875-125 Mg Tabs (Amoxicillin-pot clavulanate) .Marland Kitchen... 1 by mouth two times a day    Sudahist  12-120 Mg Xr12h-tab (Chlorpheniramine-pseudoeph) .Marland Kitchen... 1po two times a day as needed with right ear ache - treat as above, f/u any worsening signs or symptoms   Complete Medication List: 1)  Cymbalta 60 Mg Cpep (Duloxetine hcl) .Marland Kitchen.. 1 daily 2)  Nexium 20 Mg Cpdr (Esomeprazole magnesium) .Marland Kitchen.. 1 daily 3)  Nasacort Aq 55 Mcg/act Aers (Triamcinolone acetonide(nasal)) .Marland Kitchen.. 1 spray each nostril daily 4)  Veramyst 27.5 Mcg/spray Susp (Fluticasone furoate) .Marland Kitchen.. 1 spray each nostril daily 5)  Ciprofloxacin Hcl 500 Mg Tabs (Ciprofloxacin hcl) .Marland Kitchen.. 1 by mouth two times a day 6)  Vitamin D3 10000 Unit Caps (Cholecalciferol) .Marland Kitchen.. 1 by mouth two times a day 7)  Calcium Carbonate 600 Mg Tabs (Calcium carbonate) .Marland Kitchen.. 1 by mouth once daily 8)  Augmentin 875-125 Mg Tabs (Amoxicillin-pot clavulanate) .Marland Kitchen.. 1 by mouth two times a day 9)  Sudahist 12-120 Mg Xr12h-tab (Chlorpheniramine-pseudoeph) .Marland Kitchen.. 1po two times a day as needed  Other Orders: Flu Vaccine 4yrs + (91478) Admin 1st Vaccine (29562)  Patient Instructions: 1)  Please take all new medications as prescribed  2)  you had the flu shot today 3)  Continue all previous medications as before this visit  4)  You can also use Mucinex OTC or it's generic for congestion  5)  Please  schedule a follow-up appointment in 1 year or sooner if needed 6)  please call if you need to be referred to Dr Madilyn Fireman for the colonoscopy Prescriptions: SUDAHIST 12-120 MG XR12H-TAB (CHLORPHENIRAMINE-PSEUDOEPH) 1po two times a day as needed  #30 x 1   Entered and Authorized by:   Corwin Levins MD   Signed by:   Corwin Levins MD on 08/20/2009   Method used:   Print then Give to Patient   RxID:   [id number] AUGMENTIN 875-125 MG TABS (AMOXICILLIN-POT CLAVULANATE) 1 by mouth two times a day  #20 x 0   Entered and Authorized by:   Corwin Levins MD   Signed by:   Corwin Levins MD on 08/20/2009   Method used:   Print then Give to Patient   RxID:    [id number]    Immunizations Administered:  Influenza Vaccine # 1:    Vaccine Type: Fluvax 3+    Site: right deltoid    Mfr: GlaxoSmithKline    Dose: 0.5 ml    Route: IM    Given by: Windell Norfolk    Exp. Date: 05/28/2010    Lot #: [id number]  Flu Vaccine Consent Questions:    Do you have a history of severe allergic reactions to this vaccine? no    Any prior history of allergic reactions to egg and/or gelatin? no    Do you have a sensitivity to the preservative Thimersol? no    Do you have a past history of Guillan-Barre Syndrome? no    Do you currently have an acute febrile illness? no    Have you ever had a severe reaction to latex? no    Vaccine information given and explained to patient? yes    Are you currently pregnant? no

## 2010-12-31 NOTE — Assessment & Plan Note (Signed)
Summary: URINATION FREQUENCY/URGENCY--DARK URINE-$50---STC   Vital Signs:  Patient Profile:   50 Years Old Female Weight:      206.2 pounds Temp:     99.2 degrees F oral Pulse rate:   86 / minute BP sitting:   118 / 82  (left arm) Cuff size:   regular  Pt. in pain?   yes    Intensity:   4    Type:       burning  Vitals Entered By: Payton Spark CMA (August 06, 2008 3:10 PM)                  Preventive Care Screening  Colonoscopy:    Date:  11/29/2001    Next Due:  11/2006    Results:  normal   Mammogram:    Date:  11/30/2007    Results:  normal   Pap Smear:    Date:  11/30/2007    Results:  normal   Last Tetanus Booster:    Date:  12/30/1998    Results:  given    Chief Complaint:  UTI Sx's x 3days. Pt. taking AZO. Marland Kitchen  History of Present Illness: here with GU sympotms with some dysuria and freq for 2 days; no flank pain but has had some low grade temps; no N/V;     Updated Prior Medication List: CYMBALTA 60 MG CPEP (DULOXETINE HCL) 1 daily NEXIUM 20 MG CPDR (ESOMEPRAZOLE MAGNESIUM) 1 daily NASACORT AQ 55 MCG/ACT AERS (TRIAMCINOLONE ACETONIDE(NASAL)) 1 spray each nostril daily VERAMYST 27.5 MCG/SPRAY SUSP (FLUTICASONE FUROATE) 1 spray each nostril daily CIPROFLOXACIN HCL 500 MG TABS (CIPROFLOXACIN HCL) 1 by mouth two times a day  Current Allergies (reviewed today): ! NOVOCAIN (PROCAINE HCL) ! VICODIN (HYDROCODONE-ACETAMINOPHEN) ! LEVAQUIN (LEVOFLOXACIN)  Past Medical History:    Reviewed history and no changes required:       FMS       IBS       RLS       GERD       left foot DJD       lumbar DDD/herniated disc       c-spine herniated disc/disc disease       recurrent left sciatica       Hyperlipidemia       Nephrolithiasis, hx of       Allergic rhinitis       Anxiety       Depression       OSA - mild  Past Surgical History:    Reviewed history and no changes required:       Oophorectomy - left       Tonsillectomy       Tubal  ligation       Caesarean section       s/p skin cancer -"pre-melanoma" - right ear       s/p0 basket extraction renal stone 1990's   Family History:    Reviewed history and no changes required:       aunt and grandfather with asthma       father with heart disease, elevated cholesterol       mother with gastric bypass, HTN       multiple cancers including - skin, breast, colon - grandmother, uterine, renal  Social History:    Reviewed history and no changes required:       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[employer]       Former Smoker - few as teen only  Alcohol use-yes - social       4 children - 2 biological    Risk Factors:  Tobacco use:  quit Alcohol use:  no  Mammogram History:     Date of Last Mammogram:  11/30/2007    Results:  normal   PAP Smear History:     Date of Last PAP Smear:  11/30/2007    Results:  normal   Colonoscopy History:     Date of Last Colonoscopy:  11/29/2001    Results:  normal    Review of Systems  The patient denies anorexia, fever, weight loss, weight gain, vision loss, decreased hearing, hoarseness, chest pain, syncope, dyspnea on exertion, peripheral edema, prolonged cough, headaches, hemoptysis, melena, hematochezia, severe indigestion/heartburn, hematuria, incontinence, muscle weakness, suspicious skin lesions, transient blindness, difficulty walking, depression, unusual weight change, abnormal bleeding, enlarged lymph nodes, angioedema, and breast masses.         due for 5 yr colonscopy - dr hayes/GI - plans to call for appt   Physical Exam  General:     alert and overweight-appearing.   Head:     Normocephalic and atraumatic without obvious abnormalities. No apparent alopecia or balding. Eyes:     No corneal or conjunctival inflammation noted. EOMI. Perrla.  Ears:     External ear exam shows no significant lesions or deformities.  Nose:     External nasal examination shows no deformity or inflammation. Nasal mucosa are pink and moist without  lesions or exudates. Mouth:     Oral mucosa and oropharynx without lesions or exudates.  Teeth in good repair. Neck:     No deformities, masses, or tenderness noted. Lungs:     Normal respiratory effort, chest expands symmetrically. Lungs are clear to auscultation, no crackles or wheezes. Heart:     Normal rate and regular rhythm. S1 and S2 normal without gallop, murmur, click, rub or other extra sounds. Abdomen:     mild suprapubic tender o/w pos BS, soft  Msk:     normal ROM, no joint tenderness, and no joint swelling.   Extremities:     no edema, no ulcers  Neurologic:     alert & oriented X3, cranial nerves II-XII intact, and strength normal in all extremities.      Impression & Recommendations:  Problem # 1:  Preventive Health Care (ICD-V70.0) Overall doing well, up to date, counseled on routine health concerns for screening and prevention, immunizations up to date or declined, labs ordered   Orders: TLB-BMP (Basic Metabolic Panel-BMET) (80048-METABOL) TLB-CBC Platelet - w/Differential (85025-CBCD) TLB-Hepatic/Liver Function Pnl (80076-HEPATIC) TLB-Lipid Panel (80061-LIPID) TLB-TSH (Thyroid Stimulating Hormone) (84443-TSH) T-Vitamin D (25-Hydroxy) (14782-95621)   Problem # 2:  UTI (ICD-599.0)  Her updated medication list for this problem includes:    Ciprofloxacin Hcl 500 Mg Tabs (Ciprofloxacin hcl) .Marland Kitchen... 1 by mouth two times a day  Orders: TLB-Udip w/ Micro (81001-URINE) T-Culture, Urine (30865-78469) treat as above, f/u any worsening signs or symptoms , check urine cx  Complete Medication List: 1)  Cymbalta 60 Mg Cpep (Duloxetine hcl) .Marland Kitchen.. 1 daily 2)  Nexium 20 Mg Cpdr (Esomeprazole magnesium) .Marland Kitchen.. 1 daily 3)  Nasacort Aq 55 Mcg/act Aers (Triamcinolone acetonide(nasal)) .Marland Kitchen.. 1 spray each nostril daily 4)  Veramyst 27.5 Mcg/spray Susp (Fluticasone furoate) .Marland Kitchen.. 1 spray each nostril daily 5)  Ciprofloxacin Hcl 500 Mg Tabs (Ciprofloxacin hcl) .Marland Kitchen.. 1 by mouth two  times a day   Patient Instructions: 1)  Please take all new medications as prescribed 2)  Please go to the Lab in the basement for your blood tests  and urine tests today  3)  Continue all medications that you may have been taking previously 4)  Please schedule a follow-up appointment in 1 year or sooner if needed   Prescriptions: CIPROFLOXACIN HCL 500 MG TABS (CIPROFLOXACIN HCL) 1 by mouth two times a day  #20 x 0   Entered and Authorized by:   Corwin Levins MD   Signed by:   Corwin Levins MD on 08/06/2008   Method used:   Print then Give to Patient   RxID:   [id number]  ]

## 2010-12-31 NOTE — Assessment & Plan Note (Signed)
Summary: SINUS PROBLEM  STC   Vital Signs:  Patient profile:   50 year old female Height:      64 inches Weight:      210.50 pounds BMI:     36.26 O2 Sat:      99 % on Room air Temp:     97.3 degrees F oral Pulse rate:   84 / minute BP sitting:   110 / 72  (left arm) Cuff size:   large  Vitals Entered ByZella Ball Ewing (February 23, 2010 2:14 PM)  O2 Flow:  Room air CC: sinus congestion, facial pressure/RE   CC:  sinus congestion and facial pressure/RE.  History of Present Illness: here with 2 to 3 days facial pain, pressure, fever and greenish d/c  with mild nausea , headache and mild ST, but Pt denies CP, sob, doe, wheezing, orthopnea, pnd, worsening LE edema, palps, dizziness or syncope   Problems Prior to Update: 1)  Sinusitis- Acute-nos  (ICD-461.9) 2)  Sinusitis  (ICD-473.9) 3)  Preventive Health Care  (ICD-V70.0) 4)  Uti  (ICD-599.0) 5)  Preventive Health Care  (ICD-V70.0) 6)  Obstructive Sleep Apnea  (ICD-327.23) 7)  Depression  (ICD-311) 8)  Anxiety  (ICD-300.00) 9)  Ibs  (ICD-564.1) 10)  Allergic Rhinitis  (ICD-477.9) 11)  Nephrolithiasis, Hx of  (ICD-V13.01) 12)  Hyperlipidemia  (ICD-272.4) 13)  Gerd  (ICD-530.81) 14)  Restless Legs Syndrome  (ICD-333.94) 15)  Fibromyalgia  (ICD-729.1)  Medications Prior to Update: 1)  Cymbalta 60 Mg Cpep (Duloxetine Hcl) .Marland Kitchen.. 1 Daily 2)  Nexium 20 Mg Cpdr (Esomeprazole Magnesium) .Marland Kitchen.. 1 Daily 3)  Nasacort Aq 55 Mcg/act Aers (Triamcinolone Acetonide(Nasal)) .Marland Kitchen.. 1 Spray Each Nostril Daily 4)  Veramyst 27.5 Mcg/spray Susp (Fluticasone Furoate) .Marland Kitchen.. 1 Spray Each Nostril Daily 5)  Ciprofloxacin Hcl 500 Mg Tabs (Ciprofloxacin Hcl) .Marland Kitchen.. 1 By Mouth Two Times A Day 6)  Vitamin D3 10000 Unit Caps (Cholecalciferol) .Marland Kitchen.. 1 By Mouth Two Times A Day 7)  Calcium Carbonate 600 Mg Tabs (Calcium Carbonate) .Marland Kitchen.. 1 By Mouth Once Daily 8)  Augmentin 875-125 Mg Tabs (Amoxicillin-Pot Clavulanate) .Marland Kitchen.. 1 By Mouth Two Times A Day 9)  Sudahist  12-120 Mg Xr12h-Tab (Chlorpheniramine-Pseudoeph) .Marland Kitchen.. 1po Two Times A Day As Needed  Current Medications (verified): 1)  Cymbalta 60 Mg Cpep (Duloxetine Hcl) .Marland Kitchen.. 1 Daily 2)  Nexium 20 Mg Cpdr (Esomeprazole Magnesium) .Marland Kitchen.. 1 Daily 3)  Nasacort Aq 55 Mcg/act Aers (Triamcinolone Acetonide(Nasal)) .Marland Kitchen.. 1 Spray Each Nostril Daily 4)  Veramyst 27.5 Mcg/spray Susp (Fluticasone Furoate) .Marland Kitchen.. 1 Spray Each Nostril Daily 5)  Ciprofloxacin Hcl 500 Mg Tabs (Ciprofloxacin Hcl) .Marland Kitchen.. 1 By Mouth Two Times A Day 6)  Vitamin D3 10000 Unit Caps (Cholecalciferol) .Marland Kitchen.. 1 By Mouth Two Times A Day 7)  Calcium Carbonate 600 Mg Tabs (Calcium Carbonate) .Marland Kitchen.. 1 By Mouth Once Daily 8)  Clarithromycin 500 Mg Tabs (Clarithromycin) .Marland Kitchen.. 1po Two Times A Day 9)  Sudahist 12-120 Mg Xr12h-Tab (Chlorpheniramine-Pseudoeph) .Marland Kitchen.. 1po Two Times A Day As Needed  Allergies (verified): 1)  ! Novocain (Procaine Hcl) 2)  ! Vicodin (Hydrocodone-Acetaminophen) 3)  ! 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   Eyes:  vision grossly intact, pupils equal, and pupils round.   Ears:  bilat tm's red, sinus  tender bilat right more than left Nose:  nasal dischargemucosal pallor and mucosal edema.   Mouth:  pharyngeal erythema and fair dentition.   Neck:  supple and cervical lymphadenopathy.   Lungs:  normal respiratory effort and normal breath sounds.   Heart:  normal rate and regular rhythm.   Extremities:  no edema, no erythema    Impression & Recommendations:  Problem # 1:  SINUSITIS- ACUTE-NOS (ICD-461.9)  Her updated medication list for this problem includes:    Nasacort Aq 55 Mcg/act Aers (Triamcinolone acetonide(nasal)) .Marland Kitchen... 1 spray each nostril daily    Veramyst 27.5 Mcg/spray Susp (Fluticasone furoate) .Marland Kitchen... 1 spray each nostril daily    Ciprofloxacin Hcl 500 Mg Tabs (Ciprofloxacin hcl) .Marland Kitchen... 1 by mouth two times a day    Clarithromycin 500 Mg Tabs (Clarithromycin) .Marland Kitchen... 1po two times a day    Sudahist 12-120 Mg Xr12h-tab (Chlorpheniramine-pseudoeph) .Marland Kitchen... 1po two times a day as needed treat as above, f/u any worsening signs or symptoms   Complete Medication List: 1)  Cymbalta 60 Mg Cpep (Duloxetine hcl) .Marland Kitchen.. 1 daily 2)  Nexium 20 Mg Cpdr (Esomeprazole magnesium) .Marland Kitchen.. 1 daily 3)  Nasacort Aq 55 Mcg/act Aers (Triamcinolone acetonide(nasal)) .Marland Kitchen.. 1 spray each nostril daily 4)  Veramyst 27.5 Mcg/spray Susp (Fluticasone furoate) .Marland Kitchen.. 1 spray each nostril daily 5)  Ciprofloxacin Hcl 500 Mg Tabs (Ciprofloxacin hcl) .Marland Kitchen.. 1 by mouth two times a day 6)  Vitamin D3 10000 Unit Caps (Cholecalciferol) .Marland Kitchen.. 1 by mouth two times a day 7)  Calcium Carbonate 600 Mg Tabs (Calcium carbonate) .Marland Kitchen.. 1 by mouth once daily 8)  Clarithromycin 500 Mg Tabs (Clarithromycin) .Marland Kitchen.. 1po two times a day 9)  Sudahist 12-120 Mg Xr12h-tab (Chlorpheniramine-pseudoeph) .Marland Kitchen.. 1po two times a day as needed  Patient Instructions: 1)  Please take all new medications as prescribed  2)  Continue all previous medications as before this visit  3)  You can also use Mucinex OTC or it's generic for congestion  4)  Please  schedule a follow-up appointment in 6 months with CPX labs Prescriptions: CLARITHROMYCIN 500 MG TABS (CLARITHROMYCIN) 1po two times a day  #20 x 0   Entered and Authorized by:   Corwin Levins MD   Signed by:   Corwin Levins MD on 02/23/2010   Method used:   Print then Give to Patient   RxID:   [id number]

## 2010-12-31 NOTE — Procedures (Signed)
Summary: Colonoscopy/[facility]  Colonoscopy/[facility]   Imported By: Sherian Rein 05/07/2010 12:27:48  _____________________________________________________________________  External Attachment:    Type:   Image     Comment:   External Document

## 2010-12-31 NOTE — Progress Notes (Signed)
Summary: APT??  Phone Note Call from Patient   Summary of Call: Pt c/o RLQ pain. Tender to touch. Pt is a Engineer, civil (consulting). She has had kidney stones and ovarian cyst in the past. No fever or urinary symptoms. She is taking 800mg  advil w/relief but as soon as med wears off she is in pain again. Noticed some rectal bleeding this weekend w/bm but not bowel changes. Patient is requesting office visit with Dr Jonny Ruiz. Advised Dr must leave early today but would ask.  Initial call taken by: Lamar Sprinkles,  May 05, 2009 1:33 PM  Follow-up for Phone Call        sorry, am unable to see today - ok to see another MD Follow-up by: Corwin Levins MD,  May 05, 2009 1:48 PM  Additional Follow-up for Phone Call Additional follow up Details #1::        Phone Call Completed, Provider Notified left msg on machine to inform Additional Follow-up by: Shelbie Proctor,  May 05, 2009 2:04 PM

## 2010-12-31 NOTE — Assessment & Plan Note (Signed)
Summary: ?sinus inf/cd   Vital Signs:  Patient profile:   50 year old female Height:      63.5 inches Weight:      205 pounds BMI:     35.87 O2 Sat:      96 % on Room air Temp:     98.4 degrees F oral Pulse rate:   112 / minute BP sitting:   110 / 72  (left arm) Cuff size:   large  Vitals Entered By: Zella Ball Ewing CMA (AAMA) (August 11, 2010 3:08 PM)  O2 Flow:  Room air CC: Sinus congestion, sore throat, ear pain/RE   CC:  Sinus congestion, sore throat, and ear pain/RE.  History of Present Illness: here with acute onset mild to mod for 3 days facial pain, pressure, fever and greenish d/c; has mild ST but no cough and Pt denies CP, worsening sob, doe, wheezing, orthopnea, pnd, worsening LE edema, palps, dizziness or syncope  All this despite ongoing allergy nasal symptoms worse in  the past 2 wks depsite good ongoing complaicne with meds including her weekly allergy shots IM.  Pt denies new neuro symptoms such as headache, facial or extremity weakness  No  wt loss, night sweats, loss of appetite or other constitutional symptoms Son has been ill as well.  Has marked ongoing stressor with 51 yo adopted daughter with bipolar disease and ongoing behavior difficulties.  Denies worsening depressive symtpoms, suicidal ideation or panic.    Problems Prior to Update: 1)  Diarrhea  (ICD-787.91) 2)  Abdominal Pain, Lower  (ICD-789.09) 3)  Sinusitis- Acute-nos  (ICD-461.9) 4)  Sinusitis  (ICD-473.9) 5)  Preventive Health Care  (ICD-V70.0) 6)  Uti  (ICD-599.0) 7)  Preventive Health Care  (ICD-V70.0) 8)  Obstructive Sleep Apnea  (ICD-327.23) 9)  Depression  (ICD-311) 10)  Anxiety  (ICD-300.00) 11)  Ibs  (ICD-564.1) 12)  Allergic Rhinitis  (ICD-477.9) 13)  Nephrolithiasis, Hx of  (ICD-V13.01) 14)  Hyperlipidemia  (ICD-272.4) 15)  Gerd  (ICD-530.81) 16)  Restless Legs Syndrome  (ICD-333.94) 17)  Fibromyalgia  (ICD-729.1)  Medications Prior to Update: 1)  Cymbalta 60 Mg Cpep (Duloxetine  Hcl) .Marland Kitchen.. 1 Daily 2)  Nexium 20 Mg Cpdr (Esomeprazole Magnesium) .Marland Kitchen.. 1 Daily 3)  Nasacort Aq 55 Mcg/act Aers (Triamcinolone Acetonide(Nasal)) .Marland Kitchen.. 1 Spray Each Nostril Daily 4)  Veramyst 27.5 Mcg/spray Susp (Fluticasone Furoate) .Marland Kitchen.. 1 Spray Each Nostril Daily 5)  Ciprofloxacin Hcl 500 Mg Tabs (Ciprofloxacin Hcl) .Marland Kitchen.. 1 By Mouth Two Times A Day 6)  Vitamin D3 10000 Unit Caps (Cholecalciferol) .Marland Kitchen.. 1 By Mouth Two Times A Day 7)  Calcium Carbonate 600 Mg Tabs (Calcium Carbonate) .Marland Kitchen.. 1 By Mouth Once Daily 8)  Promethazine Hcl 25 Mg Tabs (Promethazine Hcl) .Marland Kitchen.. 1po Q 6 Hrs As Needed 9)  Lomotil 2.5-0.025 Mg Tabs (Diphenoxylate-Atropine) .Marland Kitchen.. 1po As Needed Loose Stool  -  Max 8 Tabs Per Day 10)  Crestor 10 Mg Tabs (Rosuvastatin Calcium) .Marland Kitchen.. 1po Once Daily  Current Medications (verified): 1)  Cymbalta 60 Mg Cpep (Duloxetine Hcl) .Marland Kitchen.. 1 Daily 2)  Nexium 20 Mg Cpdr (Esomeprazole Magnesium) .Marland Kitchen.. 1 Daily 3)  Nasacort Aq 55 Mcg/act Aers (Triamcinolone Acetonide(Nasal)) .Marland Kitchen.. 1 Spray Each Nostril Daily 4)  Veramyst 27.5 Mcg/spray Susp (Fluticasone Furoate) .Marland Kitchen.. 1 Spray Each Nostril Daily 5)  Ciprofloxacin Hcl 500 Mg Tabs (Ciprofloxacin Hcl) .Marland Kitchen.. 1 By Mouth Two Times A Day 6)  Vitamin D3 10000 Unit Caps (Cholecalciferol) .Marland Kitchen.. 1 By Mouth Two Times A Day 7)  Calcium  Carbonate 600 Mg Tabs (Calcium Carbonate) .Marland Kitchen.. 1 By Mouth Once Daily 8)  Promethazine Hcl 25 Mg Tabs (Promethazine Hcl) .Marland Kitchen.. 1po Q 6 Hrs As Needed 9)  Lomotil 2.5-0.025 Mg Tabs (Diphenoxylate-Atropine) .Marland Kitchen.. 1po As Needed Loose Stool  -  Max 8 Tabs Per Day 10)  Crestor 10 Mg Tabs (Rosuvastatin Calcium) .Marland Kitchen.. 1po Once Daily 11)  Azithromycin 250 Mg Tabs (Azithromycin) .... 2po Qd For 1 Day, Then 1po Qd For 4days, Then Stop 12)  Tessalon Perles 100 Mg Caps (Benzonatate) .Marland Kitchen.. 1-2 By Mouth Three Times A Day As Needed For Cough  Allergies (verified): 1)  ! Novocain (Procaine Hcl) 2)  ! Vicodin (Hydrocodone-Acetaminophen) 3)  ! 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  , mild ill  Head:  normocephalic and atraumatic.   Eyes:  vision grossly intact, pupils equal, and pupils round.   Ears:  bilat tm's red, canal clear, sinus tender bilat Nose:  nasal dischargemucosal pallor and mucosal edema.   Mouth:  pharyngeal erythema and fair dentition.   Neck:  supple and cervical lymphadenopathy.   Lungs:  normal respiratory effort and normal breath sounds.   Heart:  normal rate and regular rhythm.   Extremities:  no edema, no erythema  Psych:  not depressed appearing and moderately anxious.     Impression & Recommendations:  Problem # 1:  SINUSITIS- ACUTE-NOS (ICD-461.9)  Her updated medication list for this problem includes:    Nasacort Aq 55 Mcg/act Aers (Triamcinolone acetonide(nasal)) .Marland Kitchen... 1 spray each nostril daily    Veramyst 27.5 Mcg/spray Susp (Fluticasone furoate) .Marland Kitchen... 1 spray each nostril daily    Ciprofloxacin Hcl 500 Mg Tabs (Ciprofloxacin hcl) .Marland Kitchen... 1 by mouth two times a day    Azithromycin 250 Mg Tabs (Azithromycin) .Marland Kitchen... 2po qd for 1 day, then 1po qd for 4days, then stop    Tessalon Perles 100 Mg Caps (Benzonatate) .Marland Kitchen... 1-2 by  mouth three times a day as needed for cough  Orders: Depo- Medrol 40mg  (J1030) Depo- Medrol 80mg  (J1040) Admin of Therapeutic Inj  intramuscular or subcutaneous (78295) treat as above, f/u any worsening signs or symptoms   Problem # 2:  ALLERGIC RHINITIS (ICD-477.9)  Her updated medication list for this problem includes:    Nasacort Aq 55 Mcg/act Aers (Triamcinolone acetonide(nasal)) .Marland Kitchen... 1 spray each nostril daily    Veramyst 27.5 Mcg/spray Susp (Fluticasone furoate) .Marland Kitchen... 1 spray each nostril daily    Promethazine Hcl 25 Mg Tabs (Promethazine hcl) .Marland Kitchen... 1po q 6 hrs as needed treat as above, f/u any worsening signs or symptoms , stable overall by hx and exam, ok to continue meds/tx as is except for depo Im today  Problem # 3:  ANXIETY (ICD-300.00)  Her updated medication list for this problem includes:  Cymbalta 60 Mg Cpep (Duloxetine hcl) .Marland Kitchen... 1 daily stable overall by hx and exam, ok to continue meds/tx as is , declines further meds at this time  Complete Medication List: 1)  Cymbalta 60 Mg Cpep (Duloxetine hcl) .Marland Kitchen.. 1 daily 2)  Nexium 20 Mg Cpdr (Esomeprazole magnesium) .Marland Kitchen.. 1 daily 3)  Nasacort Aq 55 Mcg/act Aers (Triamcinolone acetonide(nasal)) .Marland Kitchen.. 1 spray each nostril daily 4)  Veramyst 27.5 Mcg/spray Susp (Fluticasone furoate) .Marland Kitchen.. 1 spray each nostril daily 5)  Ciprofloxacin Hcl 500 Mg Tabs (Ciprofloxacin hcl) .Marland Kitchen.. 1 by mouth two times a day 6)  Vitamin D3 10000 Unit Caps (Cholecalciferol) .Marland Kitchen.. 1 by mouth two times a day 7)  Calcium Carbonate 600 Mg Tabs (Calcium carbonate) .Marland Kitchen.. 1 by mouth once daily 8)  Promethazine Hcl 25 Mg Tabs (Promethazine hcl) .Marland Kitchen.. 1po q 6 hrs as needed 9)  Lomotil 2.5-0.025 Mg Tabs (Diphenoxylate-atropine) .Marland Kitchen.. 1po as needed loose stool  -  max 8 tabs per day 10)  Crestor 10 Mg Tabs (Rosuvastatin calcium) .Marland Kitchen.. 1po once daily 11)  Azithromycin 250 Mg Tabs (Azithromycin) .... 2po qd for 1 day, then 1po qd for 4days, then stop 12)  Tessalon Perles  100 Mg Caps (Benzonatate) .Marland Kitchen.. 1-2 by mouth three times a day as needed for cough  Patient Instructions: 1)  you had the steroid shot today 2)  Please take all new medications as prescribed  - the antibiotic 3)  Continue all previous medications as before this visit 4)  Please followup as planned Prescriptions: TESSALON PERLES 100 MG CAPS (BENZONATATE) 1-2 by mouth three times a day as needed for cough  #60 x 1   Entered and Authorized by:   Corwin Levins MD   Signed by:   Corwin Levins MD on 08/11/2010   Method used:   Print then Give to Patient   RxID:   [id number] AZITHROMYCIN 250 MG TABS (AZITHROMYCIN) 2po qd for 1 day, then 1po qd for 4days, then stop  #6 x 1   Entered and Authorized by:   Corwin Levins MD   Signed by:   Corwin Levins MD on 08/11/2010   Method used:   Print then Give to Patient   RxID:   [id number] CRESTOR 10 MG TABS (ROSUVASTATIN CALCIUM) 1po once daily  #90 x 3   Entered and Authorized by:   Corwin Levins MD   Signed by:   Corwin Levins MD on 08/11/2010   Method used:   Print then Give to Patient   RxID:   [id number]    Medication Administration  Injection # 1:    Medication: Depo- Medrol 40mg     Diagnosis: SINUSITIS- ACUTE-NOS (ICD-461.9)    Route: IM    Site: LUOQ gluteus    Exp Date: 02/2013    Lot #: [id number]    Mfr: [employer]    Comments: Patient received 120mg  Depo-medrol    Patient tolerated injection without complications    Given by: Zella Ball Ewing CMA Duncan Dull) (August 11, 2010 3:48 PM)  Injection # 2:    Medication: Depo- Medrol 80mg     Diagnosis: SINUSITIS- ACUTE-NOS (ICD-461.9)    Route: IM    Site: LUOQ gluteus    Exp Date: 02/2013    Mfr: [employer]    Given by: Zella Ball Ewing CMA Duncan Dull) (August 11, 2010 3:47 PM)  Orders Added: 1)  Depo- Medrol 40mg  [J1030] 2)  Depo- Medrol 80mg  [J1040] 3)  Admin of Therapeutic Inj  intramuscular or subcutaneous [  96372] 4)  Est. Patient Level IV [40102]

## 2010-12-31 NOTE — Procedures (Signed)
Summary: Upper GI Endoscopy/[facility]  Upper GI Endoscopy/[facility]   Imported By: Sherian Rein 05/07/2010 12:28:40  _____________________________________________________________________  External Attachment:    Type:   Image     Comment:   External Document

## 2010-12-31 NOTE — Letter (Signed)
Summary: [facility]  [facility]   Imported By: Lennie Odor 02/05/2010 14:00:14  _____________________________________________________________________  External Attachment:    Type:   Image     Comment:   External Document

## 2010-12-31 NOTE — Letter (Signed)
Summary: pt needs colon/[facility]  pt needs colon/[facility]   Imported By: Lester Woodlawn 09/17/2008 12:03:25  _____________________________________________________________________  External Attachment:    Type:   Image     Comment:   External Document

## 2010-12-31 NOTE — Assessment & Plan Note (Signed)
Summary: FEVER-DIARRHEA-LOWER ABDOMINAL PAIN--SDR--STC   Vital Signs:  Patient profile:   50 year old female Height:      63 inches Weight:      205.50 pounds BMI:     36.53 O2 Sat:      96 % on Room air Temp:     97.6 degrees F oral Pulse rate:   120 / minute BP sitting:   110 / 78  (left arm) Cuff size:   large  Vitals Entered ByZella Ball Ewing (March 16, 2010 4:00 PM)  O2 Flow:  Room air CC: fever,diarrhea, lower abdominal pain, nausea/RE   CC:  fever, diarrhea, lower abdominal pain, and nausea/RE.  History of Present Illness: here with 2 to 3 days acute onset mild to mod illness, started with left flank pain, malaise, weakness, and diarrhea;  later that day flank pain resolved;  and the mult loose/watery stools, worse this morning - 16 episodes going to the [facility]  so far today; did not yet try immodium;  still with diffuse abd pain, a bit worse on the left, severe this morning, slight better this afternoon - ? better with lying down;  has ongoing nause waves but no vomoiting;  and no blood in urine or bowels;  last wk had 2 episode IBS and stool incontence x 2 but does not think related; had high fever and chill up to 102 - 103.  Exposed to pt at the ER with Ecoli (contact precuations done just after) with a student (not sure if the student was ill);  + several students recently with AGE - ? viral ;  no other recent antibx use or hx of c diff colitis;   here with father today who had to drive her due to feeling too ill   Problems Prior to Update: 1)  Diarrhea  (ICD-787.91) 2)  Abdominal Pain, Lower  (ICD-789.09) 3)  Sinusitis- Acute-nos  (ICD-461.9) 4)  Sinusitis  (ICD-473.9) 5)  Preventive Health Care  (ICD-V70.0) 6)  Uti  (ICD-599.0) 7)  Preventive Health Care  (ICD-V70.0) 8)  Obstructive Sleep Apnea  (ICD-327.23) 9)  Depression  (ICD-311) 10)  Anxiety  (ICD-300.00) 11)  Ibs  (ICD-564.1) 12)  Allergic Rhinitis  (ICD-477.9) 13)  Nephrolithiasis, Hx of  (ICD-V13.01) 14)   Hyperlipidemia  (ICD-272.4) 15)  Gerd  (ICD-530.81) 16)  Restless Legs Syndrome  (ICD-333.94) 17)  Fibromyalgia  (ICD-729.1)  Medications Prior to Update: 1)  Cymbalta 60 Mg Cpep (Duloxetine Hcl) .Marland Kitchen.. 1 Daily 2)  Nexium 20 Mg Cpdr (Esomeprazole Magnesium) .Marland Kitchen.. 1 Daily 3)  Nasacort Aq 55 Mcg/act Aers (Triamcinolone Acetonide(Nasal)) .Marland Kitchen.. 1 Spray Each Nostril Daily 4)  Veramyst 27.5 Mcg/spray Susp (Fluticasone Furoate) .Marland Kitchen.. 1 Spray Each Nostril Daily 5)  Ciprofloxacin Hcl 500 Mg Tabs (Ciprofloxacin Hcl) .Marland Kitchen.. 1 By Mouth Two Times A Day 6)  Vitamin D3 10000 Unit Caps (Cholecalciferol) .Marland Kitchen.. 1 By Mouth Two Times A Day 7)  Calcium Carbonate 600 Mg Tabs (Calcium Carbonate) .Marland Kitchen.. 1 By Mouth Once Daily 8)  Clarithromycin 500 Mg Tabs (Clarithromycin) .Marland Kitchen.. 1po Two Times A Day 9)  Sudahist 12-120 Mg Xr12h-Tab (Chlorpheniramine-Pseudoeph) .Marland Kitchen.. 1po Two Times A Day As Needed  Current Medications (verified): 1)  Cymbalta 60 Mg Cpep (Duloxetine Hcl) .Marland Kitchen.. 1 Daily 2)  Nexium 20 Mg Cpdr (Esomeprazole Magnesium) .Marland Kitchen.. 1 Daily 3)  Nasacort Aq 55 Mcg/act Aers (Triamcinolone Acetonide(Nasal)) .Marland Kitchen.. 1 Spray Each Nostril Daily 4)  Veramyst 27.5 Mcg/spray Susp (Fluticasone Furoate) .Marland Kitchen.. 1 Spray Each Nostril Daily 5)  Ciprofloxacin Hcl 500 Mg Tabs (Ciprofloxacin Hcl) .Marland Kitchen.. 1 By Mouth Two Times A Day 6)  Vitamin D3 10000 Unit Caps (Cholecalciferol) .Marland Kitchen.. 1 By Mouth Two Times A Day 7)  Calcium Carbonate 600 Mg Tabs (Calcium Carbonate) .Marland Kitchen.. 1 By Mouth Once Daily 8)  Promethazine Hcl 25 Mg Tabs (Promethazine Hcl) .Marland Kitchen.. 1po Q 6 Hrs As Needed 9)  Lomotil 2.5-0.025 Mg Tabs (Diphenoxylate-Atropine) .Marland Kitchen.. 1po As Needed Loose Stool  -  Max 8 Tabs Per Day 10)  Crestor 10 Mg Tabs (Rosuvastatin Calcium) .Marland Kitchen.. 1po Once Daily  Allergies (verified): 1)  ! Novocain (Procaine Hcl) 2)  ! Vicodin (Hydrocodone-Acetaminophen) 3)  ! 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  , mild ill  Head:  normocephalic and atraumatic.   Eyes:  vision grossly intact, pupils equal, and pupils round.   Ears:  right TM ok, left tm mild erythema, nonbulging Nose:  nasal dischargemucosal pallor and mucosal edema.   Mouth:  pharyngeal erythema and fair dentition.   Neck:  supple.  , with left mild tender LA x 2 nodes ant cervical change about mid SCM Lungs:  normal respiratory effort and normal breath sounds.   Heart:  normal rate and regular rhythm.   Abdomen:  soft and normal bowel sounds.  , with mild tender througout, but worse to mid lower mid abd area, and somewhat LLQ , without guarding or rebound Msk:  no joint tenderness and no joint swelling.   Extremities:  no edema, no erythema  Skin:  color normal and no rashes.     Impression & Recommendations:  Problem # 1:  ABDOMINAL PAIN, LOWER (ICD-789.09) with n/v, fever x 2 days (afeb today), and diarrhea  as per HPI , as well as signficant tender on exam  - to check urine studies, gave cipro but should hold on taking pending UA and cx;  also gave antiemetic and lomotil as needed for most likely viral AGE (although cant r/o bact colitis but fortunately no blood so far) Orders: T-Culture, Urine ([id number]) TLB-CBC Platelet -  w/Differential (85025-CBCD) TLB-Udip w/ Micro (81001-URINE)  Problem # 2:  DIARRHEA (ICD-787.91)  Her updated medication list for this problem includes:    Lomotil 2.5-0.025 Mg Tabs (Diphenoxylate-atropine) .Marland Kitchen... 1po as needed loose stool  -  max 8 tabs per day treat as above, f/u any worsening signs or symptoms , discussion as above  Problem # 3:  HYPERLIPIDEMIA (ICD-272.4)  Her updated medication list for this problem includes:    Crestor 10 Mg Tabs (Rosuvastatin calcium) .Marland Kitchen... 1po once daily  Labs Reviewed: SGOT: 36 (08/20/2009)   SGPT: 52 (08/20/2009)   HDL:42.10 (08/20/2009), 36.9 (08/06/2008)  LDL:DEL (08/06/2008)  Chol:212 (08/20/2009), 249 (08/06/2008)  Trig:173.0 (08/20/2009), 218 (08/06/2008) at end of exam today, pt states she is ready to begin  statin, to which I agreed but should wait until current illness improved, to begin I estimate in 2 wks  Complete Medication List: 1)  Cymbalta 60 Mg Cpep (Duloxetine hcl) .Marland Kitchen.. 1 daily 2)  Nexium 20 Mg Cpdr (Esomeprazole magnesium) .Marland Kitchen.. 1 daily 3)  Nasacort Aq 55 Mcg/act Aers (Triamcinolone acetonide(nasal)) .Marland Kitchen.. 1 spray each nostril daily 4)  Veramyst 27.5 Mcg/spray Susp (Fluticasone furoate) .Marland Kitchen.. 1 spray each nostril daily 5)  Ciprofloxacin Hcl 500 Mg Tabs (Ciprofloxacin hcl) .Marland Kitchen.. 1 by mouth two times a day 6)  Vitamin D3 10000 Unit Caps (Cholecalciferol) .Marland Kitchen.. 1 by mouth two times a day 7)  Calcium Carbonate 600 Mg Tabs (Calcium carbonate) .Marland Kitchen.. 1 by mouth once daily 8)  Promethazine Hcl 25 Mg Tabs (Promethazine hcl) .Marland Kitchen.. 1po q 6 hrs as needed 9)  Lomotil 2.5-0.025 Mg Tabs (Diphenoxylate-atropine) .Marland Kitchen.. 1po as needed loose stool  -  max 8 tabs per day 10)  Crestor 10 Mg Tabs (Rosuvastatin calcium) .Marland Kitchen.. 1po once daily  Patient Instructions: 1)  Please take all new medications as prescribed 2)  Start the crestor 10 mg per day in 2 wks after your acute illness is over 3)  Continue all previous medications as before this visit  4)   Please go to the Lab in the basement for your blood and/or urine tests today 5)  Please schedule a follow-up appointment  Sept 2011 with CPX labs Prescriptions: CRESTOR 10 MG TABS (ROSUVASTATIN CALCIUM) 1po once daily  #30 x 11   Entered and Authorized by:   Corwin Levins MD   Signed by:   Corwin Levins MD on 03/16/2010   Method used:   Print then Give to Patient   RxID:   [id number] LOMOTIL 2.5-0.025 MG TABS (DIPHENOXYLATE-ATROPINE) 1po as needed loose stool  -  max 8 tabs per day  #40 x 1   Entered and Authorized by:   Corwin Levins MD   Signed by:   Corwin Levins MD on 03/16/2010   Method used:   Print then Give to Patient   RxID:   [id number] PROMETHAZINE HCL 25 MG TABS (PROMETHAZINE HCL) 1po q 6 hrs as needed  #40 x 0   Entered and Authorized by:   Corwin Levins MD   Signed by:   Corwin Levins MD on 03/16/2010   Method used:   Print then Give to Patient   RxID:   [id number] CIPROFLOXACIN HCL 500 MG TABS (CIPROFLOXACIN HCL) 1 by mouth two times a day  #20 x 0   Entered and Authorized by:   Corwin Levins MD   Signed by:   Corwin Levins MD on 03/16/2010   Method used:   Print then Give to Patient   RxID:   [id number]

## 2010-12-31 NOTE — Letter (Signed)
Summary: Generic Letter  [facility]  [address] [place], [place] [address]   Phone: [phone]  Fax: [phone]    08/25/2010  Jody Walker [address] [place], [place]  [address]  Dear Ms. Dibella,  This is to inform the above patient has had the Flu Vaccine 08/25/2010.    Lot Number:[id number]   Exp Date:05/29/2011   Site Given  Left Deltoid IM       Sincerely,   Dr.James Jonny Ruiz

## 2010-12-31 NOTE — Letter (Signed)
Summary: [facility]  [facility]   Imported By: Maryln Gottron 11/05/2008 11:25:14  _____________________________________________________________________  External Attachment:    Type:   Image     Comment:   External Document

## 2011-02-15 LAB — COMPREHENSIVE METABOLIC PANEL
ALT: 25 U/L (ref 0–35)
AST: 23 U/L (ref 0–37)
Albumin: 4 g/dL (ref 3.5–5.2)
Alkaline Phosphatase: 77 U/L (ref 39–117)
BUN: 11 mg/dL (ref 6–23)
CO2: 27 mEq/L (ref 19–32)
Calcium: 9.4 mg/dL (ref 8.4–10.5)
Chloride: 107 mEq/L (ref 96–112)
Creatinine, Ser: 0.59 mg/dL (ref 0.4–1.2)
GFR calc Af Amer: >60 mL/min (ref >60)
GFR calc non Af Amer: >60 mL/min (ref >60)
Glucose, Bld: 100 mg/dL — ABNORMAL HIGH (ref 70–99)
Potassium: 3.9 mEq/L (ref 3.5–5.1)
Sodium: 140 mEq/L (ref 135–145)
Total Bilirubin: 0.5 mg/dL (ref 0.3–1.2)
Total Protein: 7.4 g/dL (ref 6.0–8.3)

## 2011-02-15 LAB — CBC
HCT: 39.8 % (ref 36.0–46.0)
Hemoglobin: 13.8 g/dL (ref 12.0–15.0)
MCHC: 34.6 g/dL (ref 30.0–36.0)
MCV: 91.4 fL (ref 78.0–100.0)
Platelets: 246 10*3/uL (ref 150–400)
RBC: 4.35 MIL/uL (ref 3.87–5.11)
RDW: 12.7 % (ref 11.5–15.5)
WBC: 9.8 10*3/uL (ref 4.0–10.5)

## 2011-02-15 LAB — DIFFERENTIAL
Basophils Absolute: 0 10*3/uL (ref 0.0–0.1)
Basophils Relative: 0 % (ref 0–1)
Eosinophils Absolute: 0 10*3/uL (ref 0.0–0.7)
Eosinophils Relative: 0 % (ref 0–5)
Lymphocytes Relative: 29 % (ref 12–46)
Lymphs Abs: 2.9 10*3/uL (ref 0.7–4.0)
Monocytes Absolute: 0.5 10*3/uL (ref 0.1–1.0)
Monocytes Relative: 5 % (ref 3–12)
Neutro Abs: 6.4 10*3/uL (ref 1.7–7.7)
Neutrophils Relative %: 65 % (ref 43–77)

## 2011-02-16 LAB — CBC
HCT: 41.3 % (ref 36.0–46.0)
Hemoglobin: 14 g/dL (ref 12.0–15.0)
MCHC: 34 g/dL (ref 30.0–36.0)
MCV: 90 fL (ref 78.0–100.0)
Platelets: 276 10*3/uL (ref 150–400)
RBC: 4.58 MIL/uL (ref 3.87–5.11)
RDW: 11.6 % (ref 11.5–15.5)
WBC: 7.9 10*3/uL (ref 4.0–10.5)

## 2011-02-16 LAB — URINALYSIS, ROUTINE W REFLEX MICROSCOPIC
Bilirubin Urine: NEGATIVE
Glucose, UA: NEGATIVE mg/dL
Ketones, ur: NEGATIVE mg/dL
Leukocytes, UA: NEGATIVE
Nitrite: NEGATIVE
Protein, ur: NEGATIVE mg/dL
Specific Gravity, Urine: 1.012 (ref 1.005–1.030)
Urobilinogen, UA: 1 mg/dL (ref 0.0–1.0)
pH: 7 (ref 5.0–8.0)

## 2011-02-16 LAB — COMPREHENSIVE METABOLIC PANEL
ALT: 31 U/L (ref 0–35)
AST: 28 U/L (ref 0–37)
Albumin: 4.6 g/dL (ref 3.5–5.2)
Alkaline Phosphatase: 86 U/L (ref 39–117)
BUN: 11 mg/dL (ref 6–23)
CO2: 27 mEq/L (ref 19–32)
Calcium: 9.5 mg/dL (ref 8.4–10.5)
Chloride: 104 mEq/L (ref 96–112)
Creatinine, Ser: 0.7 mg/dL (ref 0.4–1.2)
GFR calc Af Amer: >60 mL/min (ref >60)
GFR calc non Af Amer: >60 mL/min (ref >60)
Glucose, Bld: 110 mg/dL — ABNORMAL HIGH (ref 70–99)
Potassium: 3.6 mEq/L (ref 3.5–5.1)
Sodium: 147 mEq/L — ABNORMAL HIGH (ref 135–145)
Total Bilirubin: 0.6 mg/dL (ref 0.3–1.2)
Total Protein: 8.3 g/dL (ref 6.0–8.3)

## 2011-02-16 LAB — POCT CARDIAC MARKERS
CKMB, poc: <1 ng/mL — ABNORMAL LOW (ref 1.0–8.0)
Myoglobin, poc: 59.3 ng/mL (ref 12–200)
Troponin i, poc: <0.05 ng/mL (ref 0.00–0.09)

## 2011-02-16 LAB — URINE MICROSCOPIC-ADD ON

## 2011-02-16 LAB — DIFFERENTIAL
Basophils Absolute: 0.1 10*3/uL (ref 0.0–0.1)
Basophils Relative: 1 % (ref 0–1)
Eosinophils Absolute: 0.1 10*3/uL (ref 0.0–0.7)
Eosinophils Relative: 1 % (ref 0–5)
Lymphocytes Relative: 38 % (ref 12–46)
Lymphs Abs: 3 10*3/uL (ref 0.7–4.0)
Monocytes Absolute: 0.7 10*3/uL (ref 0.1–1.0)
Monocytes Relative: 8 % (ref 3–12)
Neutro Abs: 4 10*3/uL (ref 1.7–7.7)
Neutrophils Relative %: 52 % (ref 43–77)

## 2011-02-16 LAB — LIPASE, BLOOD: Lipase: 123 U/L (ref 23–300)

## 2011-04-16 NOTE — Op Note (Signed)
   NAME:  Jody [name], Jody [name]                          ACCOUNT NO.:  [id number]   MEDICAL RECORD NO.:  [id number]                   PATIENT TYPE:  AMB   LOCATION:  ENDO                                 FACILITY:  [facility]   PHYSICIAN:  John C. Madilyn Fireman, M.D.                 DATE OF BIRTH:  Mar 20, 1961   DATE OF PROCEDURE:  07/09/2003  DATE OF DISCHARGE:                                 OPERATIVE REPORT   PROCEDURE:  Colonoscopy.   INDICATION FOR PROCEDURE:  Family history of colon cancer in a first degree  relative.  Last colonoscopy five years ago.   DESCRIPTION OF PROCEDURE:  The patient was placed in the left lateral  decubitus position and placed on a pulse monitor with continuous low-flow  oxygen delivered by nasal cannula.  She was sedated with 25 mg IV Demerol  and 4 mg IV Versed in addition to the medicine given for the previous EGD.  The Olympus video colonoscope was inserted into the rectum and advanced to  the cecum, confirmed by transillumination of McBurney's point and  visualization of the ileocecal valve and appendiceal orifice. The prep was  good.  The cecum, ascending, transverse, descending, and sigmoid colon all  appeared normal with no masses, polyps, diverticula, or other mucosal  abnormalities.  The rectum likewise appeared normal, and retroflexed view of  the anus revealed no obvious internal hemorrhoids.  The scope was then  withdrawn and the patient returned to the recovery room in stable condition.  She tolerated the procedure well, and there were no immediate complications.   IMPRESSION:  Normal colonoscopy.   PLAN:  Repeat colonoscopy in five years based on family history.                                               John C. Madilyn Fireman, M.D.    [facility]  D:  07/09/2003  T:  07/10/2003  Job:  [id number]

## 2011-04-16 NOTE — Op Note (Signed)
   NAME:  Jody, [name]                          ACCOUNT NO.:  [id number]   MEDICAL RECORD NO.:  [id number]                   PATIENT TYPE:  AMB   LOCATION:  ENDO                                 FACILITY:  [facility]   PHYSICIAN:  John C. Madilyn Fireman, M.D.                 DATE OF BIRTH:  Oct 23, 1961   DATE OF PROCEDURE:  07/09/2003  DATE OF DISCHARGE:                                 OPERATIVE REPORT   PROCEDURE:  Esophagogastroduodenoscopy with biopsy.   INDICATIONS FOR PROCEDURE:  Abdominal pain in a patient already on Nexium  who is also undergoing colonoscopy today for a family history of colon  cancer.   DESCRIPTION OF PROCEDURE:  The patient was placed in the left lateral  decubitus position then placed on the pulse monitor with continuous low flow  oxygen delivered by nasal cannula. She was sedated with 75 mg IV Demerol and  7 mg IV Versed. The Olympus video endoscope was advanced under direct vision  into the oropharynx and esophagus. The esophagus was straight and of normal  caliber at the squamocolumnar line at 38 cm. There was no visible hiatal  hernia, ring, stricture or other abnormality of the GE junction. The stomach  was entered and a small amount of liquid secretions were suctioned from the  fundus. Retroflexed view of the cardia was unremarkable. The fundus, body,  antrum and pylorus all appeared normal. The duodenum was entered and both  the bulb and second portion are well inspected and appear to be within  normal limits. The scope was withdrawn back in the stomach and a CLOtest  obtained.  The scope was then withdrawn and the patient returned to the  recovery room in stable condition. She tolerated the procedure well and  there were no immediate complications.   IMPRESSION:  Normal endoscopy.   PLAN:  1. Await CLOtest and treat for eradication of helicobacter if positive. Will     consider treatment with an antispasmodic if negative.  2. Proceed with colonoscopy.                                               John C. Madilyn Fireman, M.D.    J[facility]/MEDQ  D:  07/09/2003  T:  07/10/2003  Job:  [id number]

## 2011-06-15 IMAGING — US US ABDOMEN COMPLETE
1 series · 13 of 25 positions shown · non-contrast
Comparison: None.

CLINICAL DATA: Epigastric pain and sudden onset of severe nausea
and vomiting.

COMPLETE ABDOMINAL ULTRASOUND

[Series 1: us abdomen complete · 0.35mm/px · 13 of 73 slices shown]
[im 1/73]
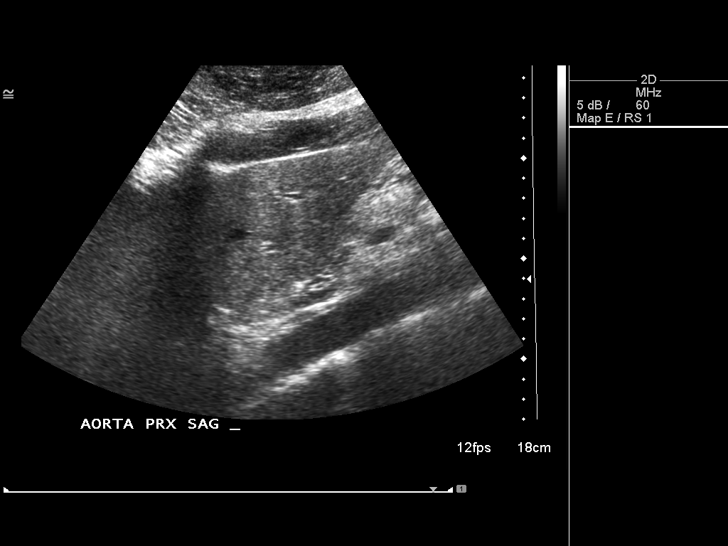
[im 7/73]
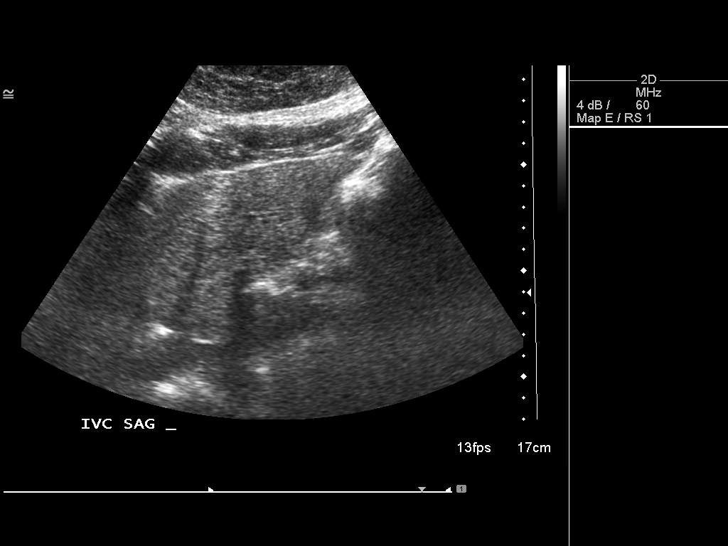
[im 13/73]
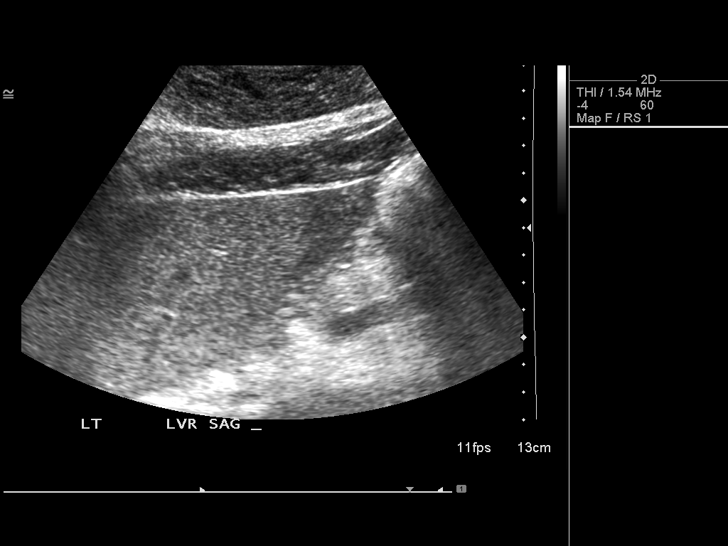
[im 19/73]
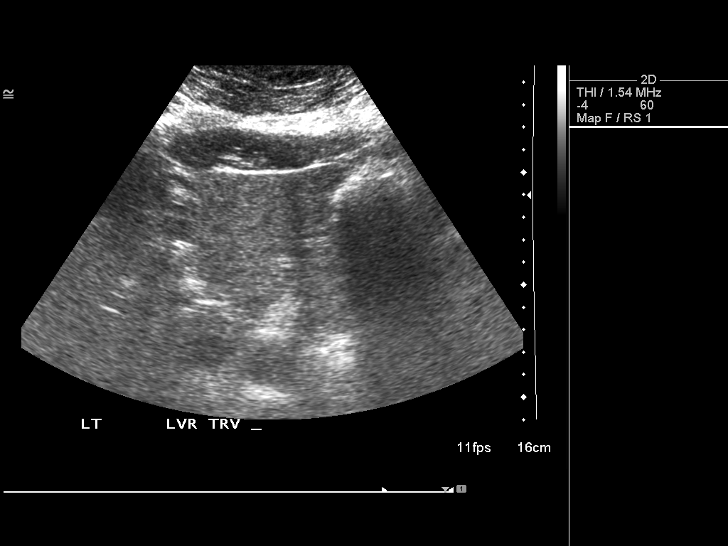
[im 25/73]
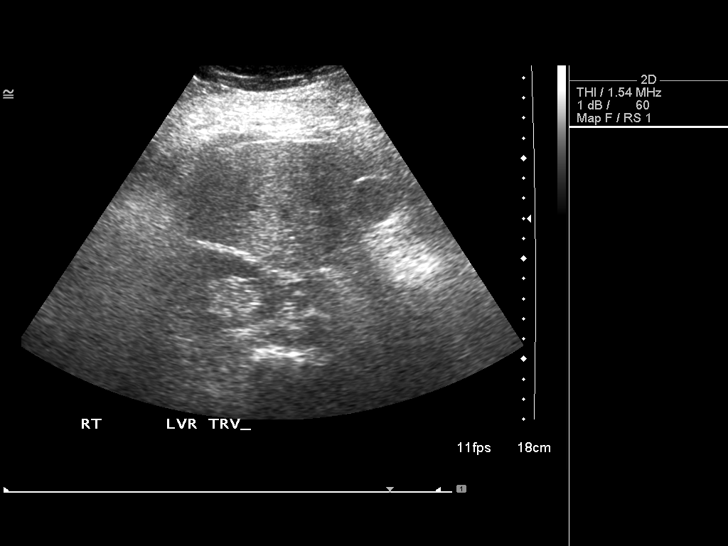
[im 31/73]
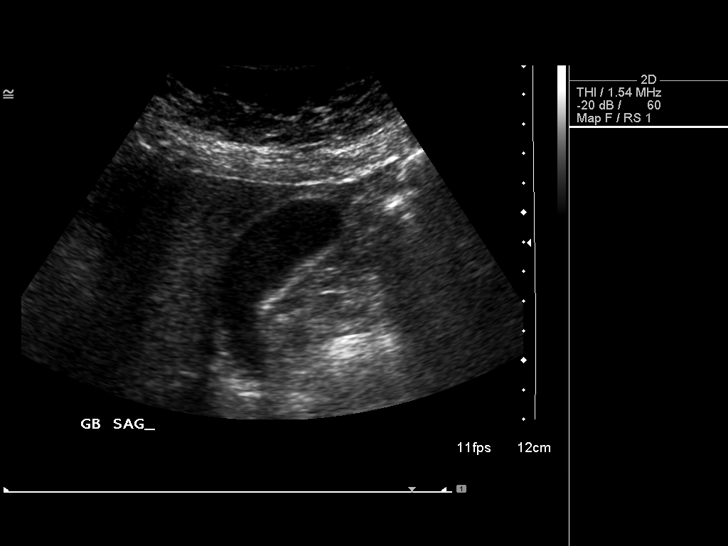
[im 37/73]
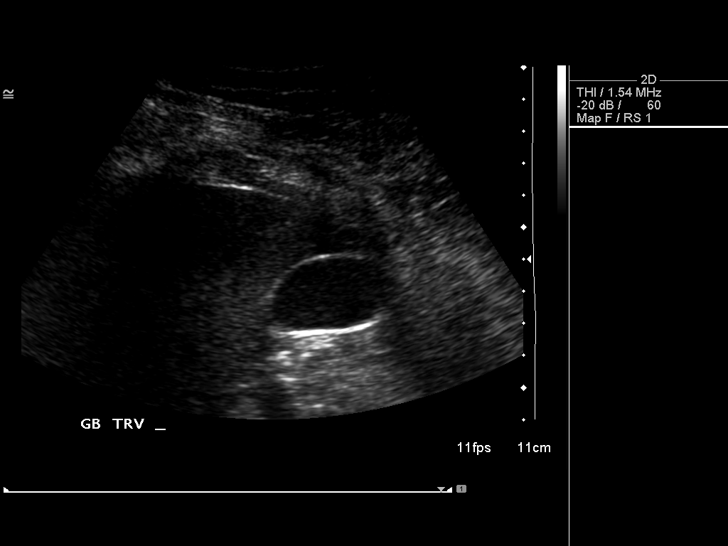
[im 43/73]
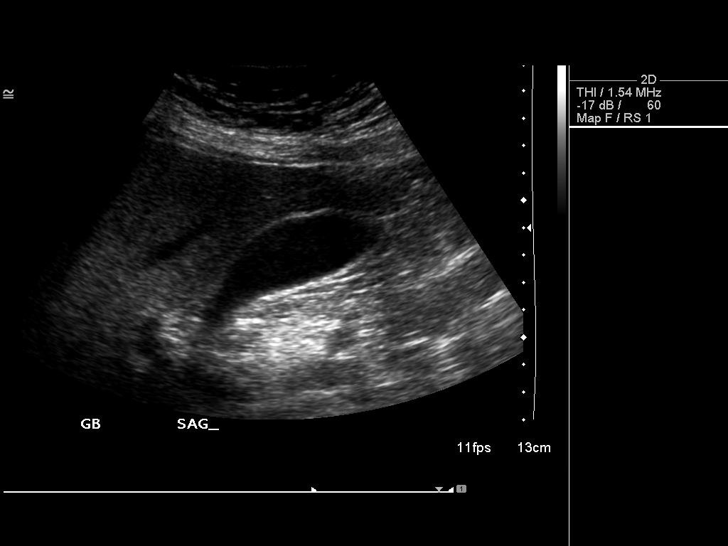
[im 49/73]
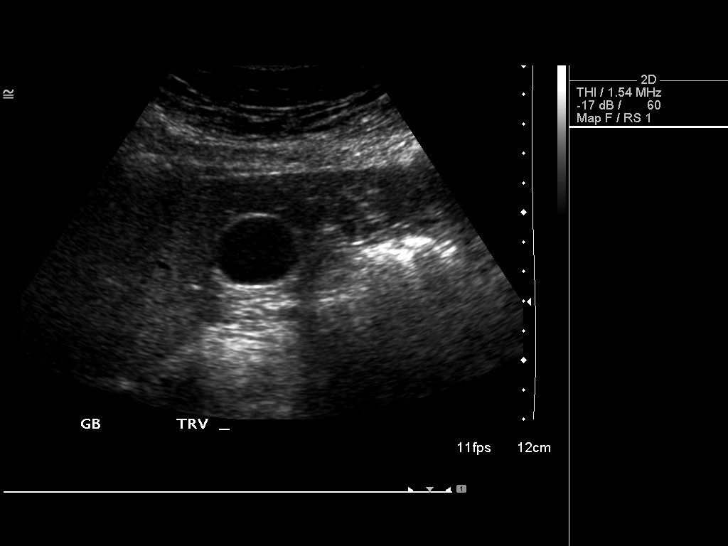
[im 55/73]
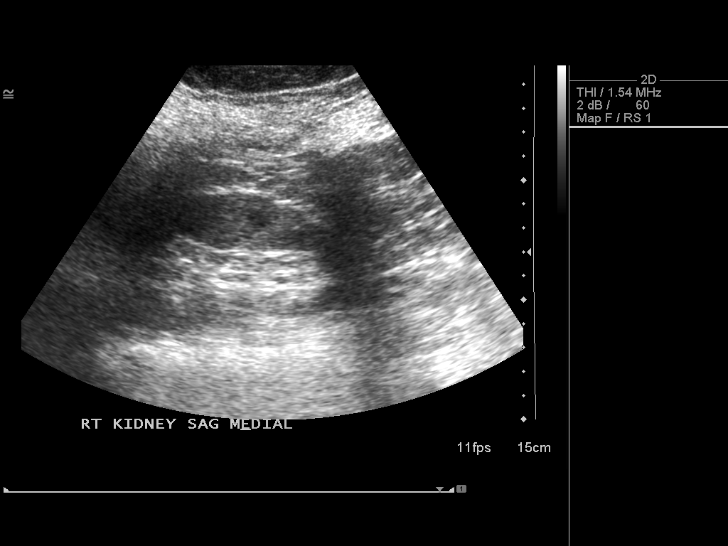
[im 61/73]
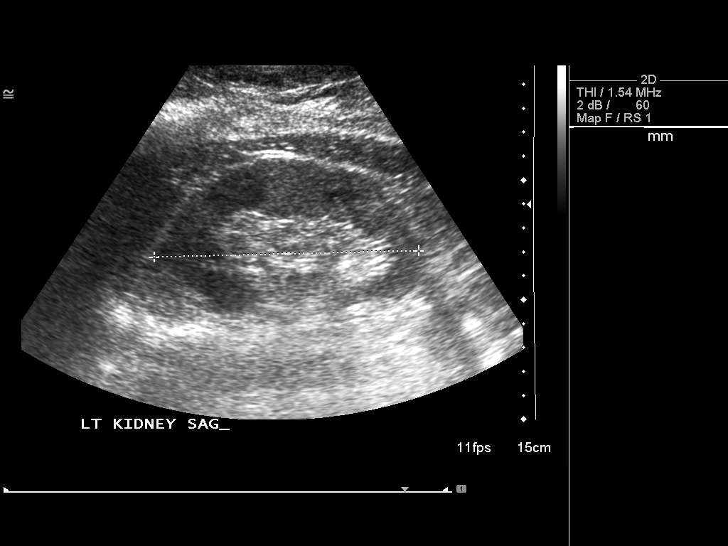
[im 67/73]
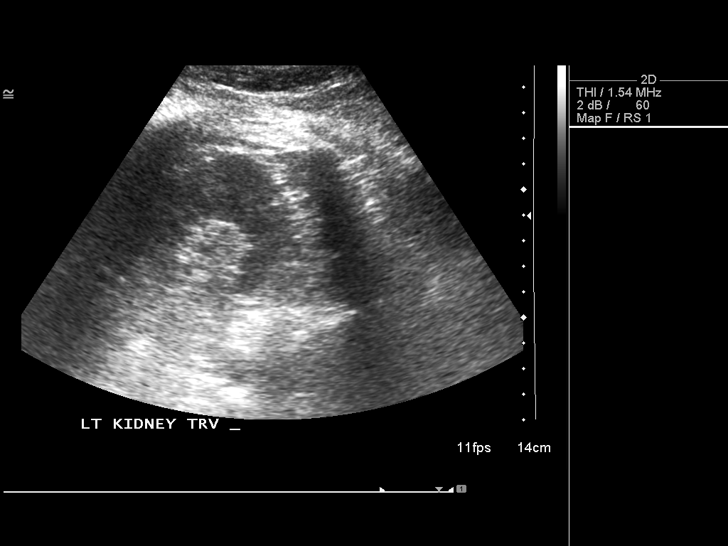
[im 73/73]
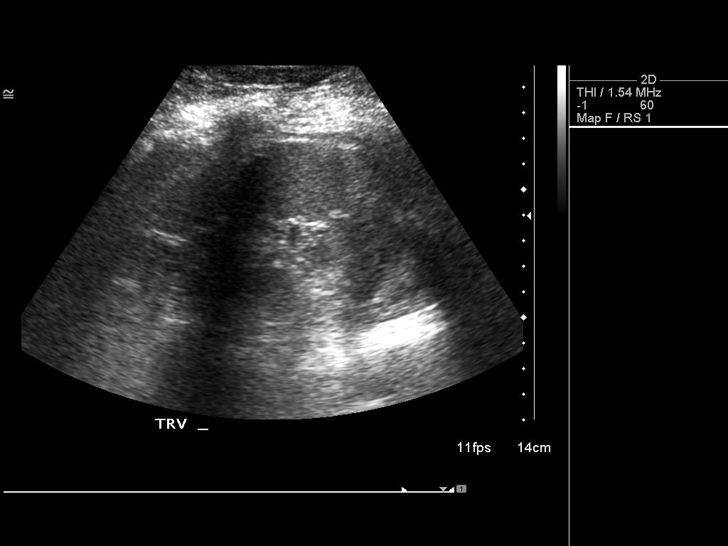

[13 of 25 positions shown; findings below may reference images not displayed]

FINDINGS: Gallbladder:  No gallstones, gallbladder wall thickening, or
pericholecystic fluid. Maximal wall thickness is 2.6 mm

Common bile duct:  Measures 2.9 mm, within normal limits.

Liver:  Diffuse increased echotexture is noted.  There is loss of
internal architecture.  No focal lesions are identified.

IVC:  Visualization is limited due to overlying bowel gas and body
habitus.

Pancreas:  No focal abnormality seen. Visualization of the distal
pancreas is limited due to patient body habitus and bowel gas.

Spleen:  Measures 7.6 cm, within normal limits.  Echotexture is
normal.

Right Kidney:  The right kidney is of normal size and echo texture,
measuring 10.7 cm maximally.  No focal stone, mass lesion, or
hydronephrosis is present.

Left Kidney:  The left kidney is of normal size and echotexture,
measuring 11.1 cm.  No focal stone, mass lesion, or hydronephrosis
is present.

Abdominal aorta:  No aneurysm identified. The distal aorta is not
well visualized due to patient body habitus and bowel gas.
IMPRESSION: 1.  No acute abnormality.
2.  Diffuse fatty infiltration of the liver.
3.  The study is somewhat limited due to patient body habitus and
bowel gas.

## 2011-06-15 IMAGING — CT CT ABD-PELV W/O CM
2 of 4 series · 17 of 46 positions shown, 19 images · non-contrast
Comparison: None

CLINICAL DATA: Epigastric pain and this area.

CT ABDOMEN AND PELVIS WITHOUT CONTRAST
TECHNIQUE: Multidetector CT imaging of the abdomen and pelvis was
performed following the standard protocol without intravenous
contrast.

[Series 2: renal stone > 200 lbs 5.0 b31f · axial · 0.79mm/px · z∈[-707,-302]mm · 14 of 89 slices shown, 16 images]
[im 4/89  soft-tissue]
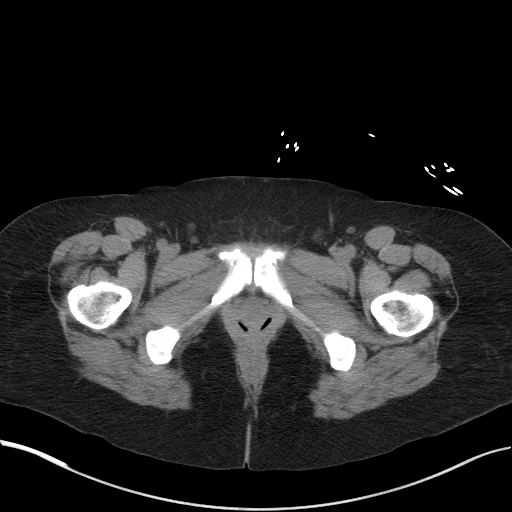
[im 4/89  bone]
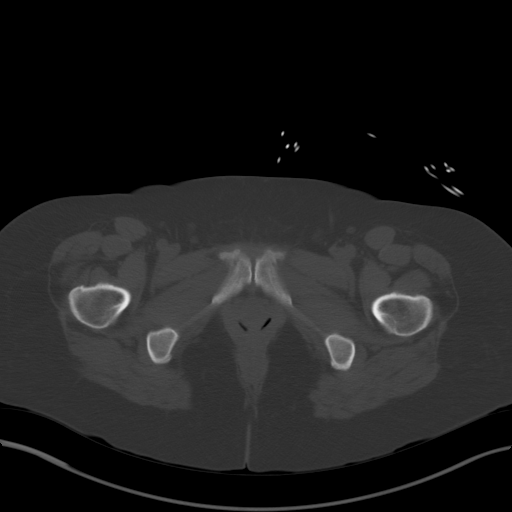
[im 12/89  soft-tissue]
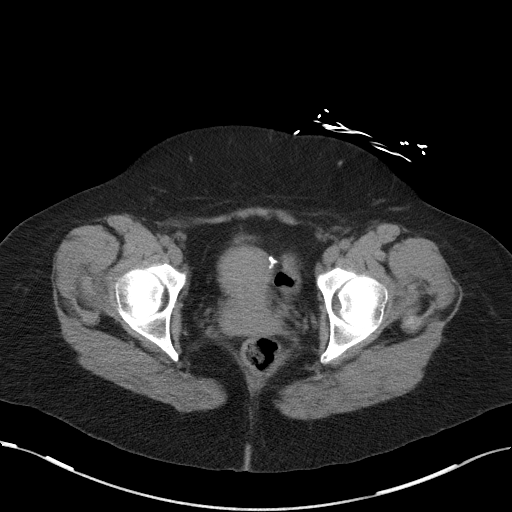
[im 19/89  soft-tissue]
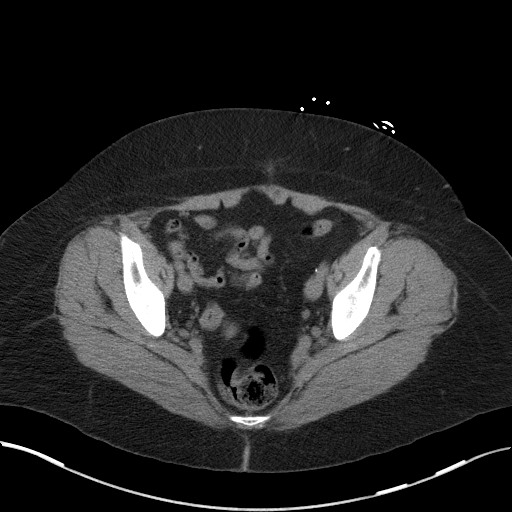
[im 23/89  soft-tissue]
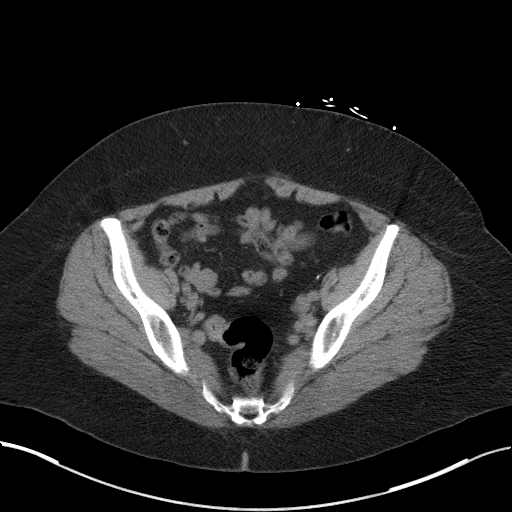
[im 30/89  soft-tissue]
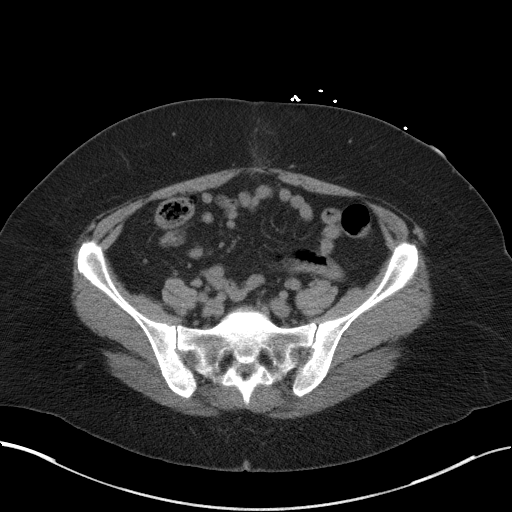
[im 37/89  soft-tissue]
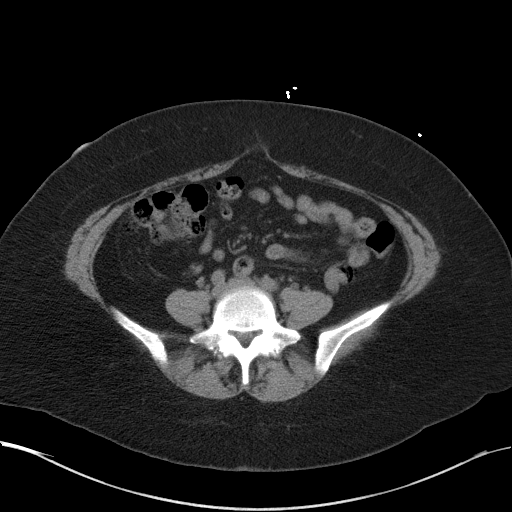
[im 41/89  soft-tissue]
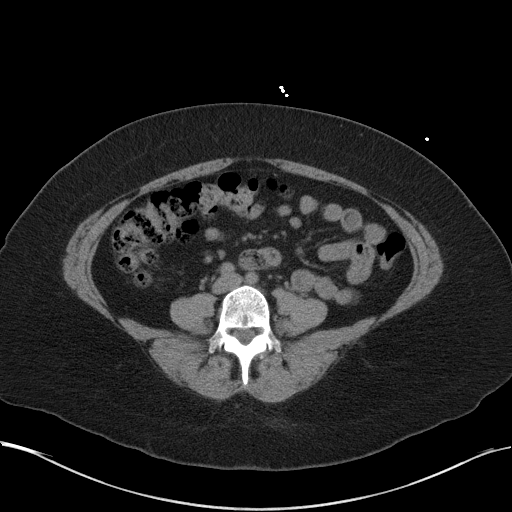
[im 48/89  soft-tissue]
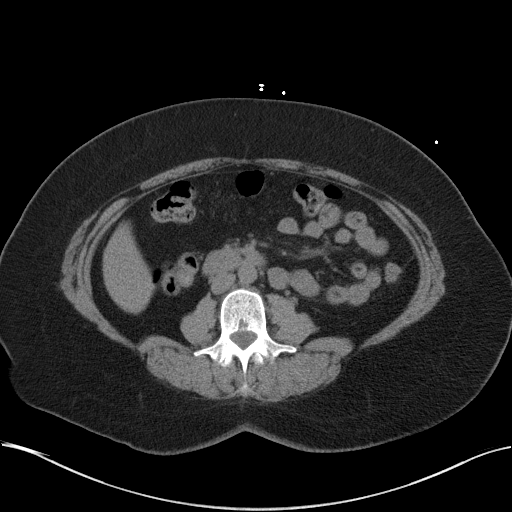
[im 52/89  soft-tissue]
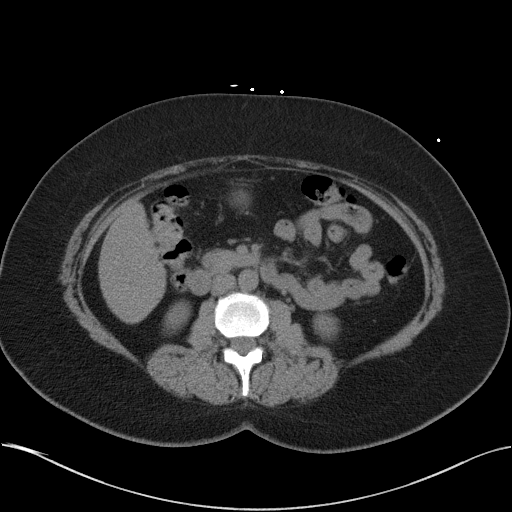
[im 52/89  bone]
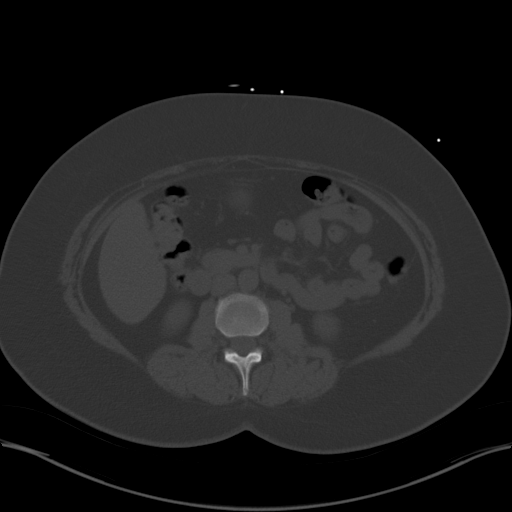
[im 59/89  soft-tissue]
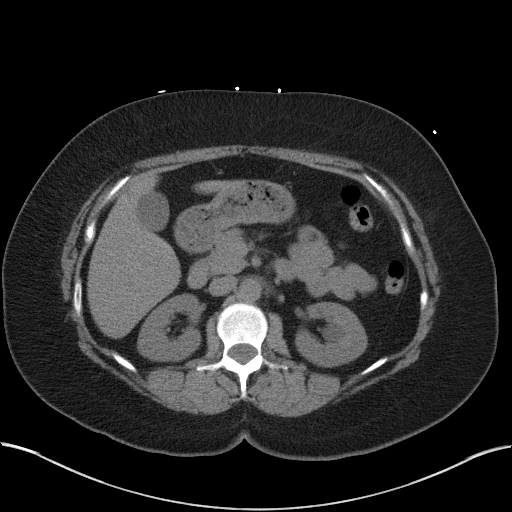
[im 67/89  soft-tissue]
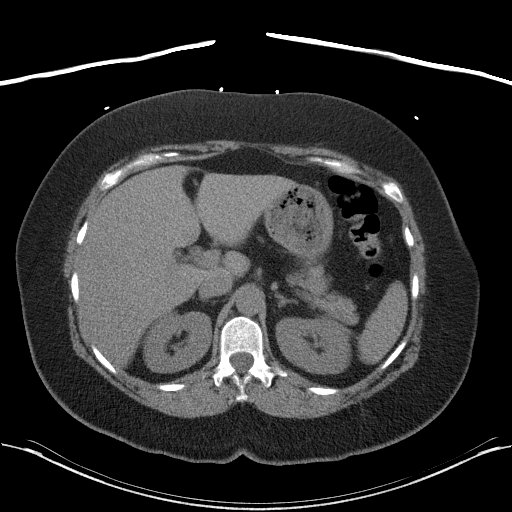
[im 70/89  soft-tissue]
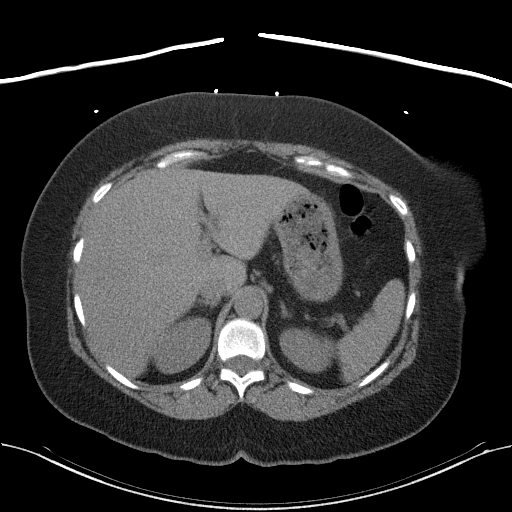
[im 78/89  soft-tissue]
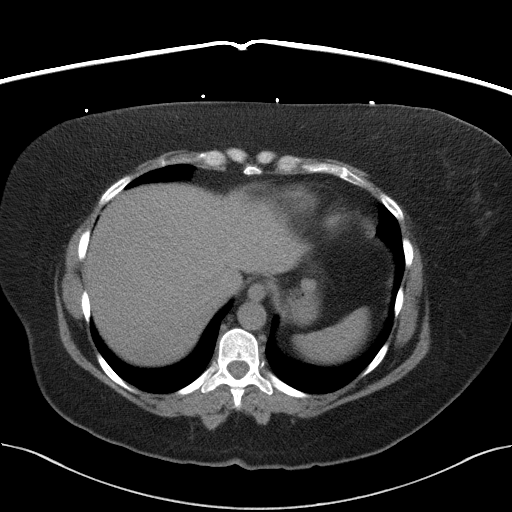
[im 85/89  soft-tissue]
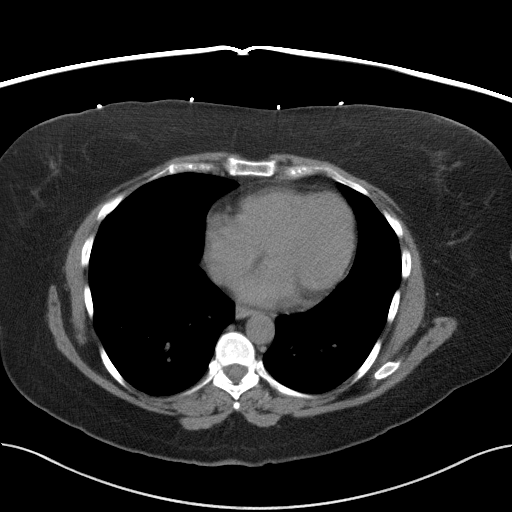

[Series 5: renal stone 3.0 coronal · coronal · 0.83mm/px · 3 of 91 slices shown]
[im 31/91  soft-tissue]
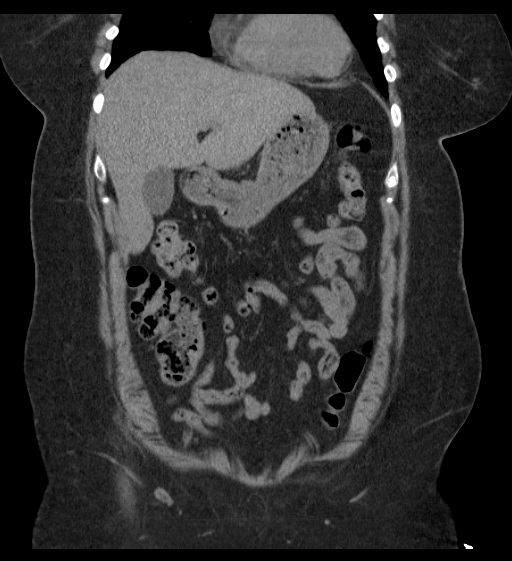
[im 41/91  soft-tissue]
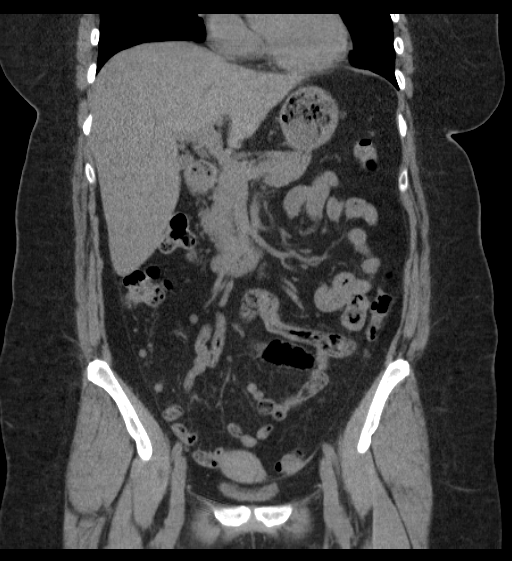
[im 51/91  soft-tissue]
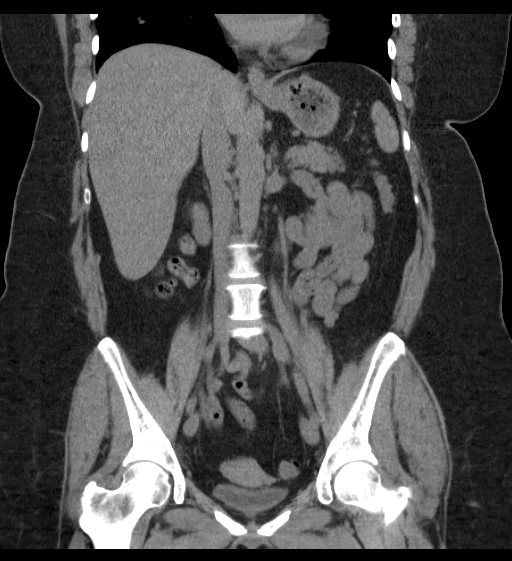

[17 of 46 positions shown; findings below may reference images not displayed]

FINDINGS: The lung bases are clear.

The unenhanced appearance of the liver and spleen are unremarkable.
The pancreas appears normal and the adrenal glands and kidneys are
normal.  No renal or obstructing ureteral calculi.

The stomach, duodenum, small bowel and colon grossly normal but the
study is limited without oral contrast.  The appendix is normal.
The aorta is normal in caliber.  No mesenteric or retroperitoneal
masses or adenopathy.

The uterus and right ovary are normal.  I do not see the left ovary
for certain.  No pelvic mass, adenopathy or free pelvic fluid
collections.  No inguinal mass or hernia.

The bony structures are unremarkable.
IMPRESSION: No acute abdominal/pelvic findings, mass lesions or adenopathy.

## 2011-08-11 ENCOUNTER — Telehealth: Payer: Self-pay | Admitting: *Deleted

## 2011-08-11 NOTE — Telephone Encounter (Signed)
Pt informed, immunization report left upfront in cabinet for pt to pickup.

## 2011-08-11 NOTE — Telephone Encounter (Signed)
Pt left message regarding last Tetanus immuzation. 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 Left message for pt to callback office regarding message.

## 2011-09-03 ENCOUNTER — Ambulatory Visit: Payer: Self-pay | Admitting: Gynecology

## 2011-09-28 DIAGNOSIS — D0510 Intraductal carcinoma in situ of unspecified breast: Secondary | ICD-10-CM | POA: Insufficient documentation

## 2012-03-01 ENCOUNTER — Telehealth: Payer: Self-pay | Admitting: Internal Medicine

## 2012-03-01 NOTE — Telephone Encounter (Signed)
Received copies from  [facility],on 03/01/12. Forwarded  4 pages to Dr. Candi Leash review.

## 2012-03-30 ENCOUNTER — Telehealth: Payer: Self-pay | Admitting: Obstetrics and Gynecology

## 2012-04-26 ENCOUNTER — Telehealth: Payer: Self-pay | Admitting: Internal Medicine

## 2012-04-26 NOTE — Telephone Encounter (Signed)
Received 3 pages from [facility], sent to Dr. Jonny Ruiz. SD 04/26/12.

## 2012-05-24 ENCOUNTER — Ambulatory Visit (INDEPENDENT_AMBULATORY_CARE_PROVIDER_SITE_OTHER): Payer: BC Managed Care – PPO | Admitting: Internal Medicine

## 2012-05-24 ENCOUNTER — Encounter: Payer: Self-pay | Admitting: Internal Medicine

## 2012-05-24 ENCOUNTER — Other Ambulatory Visit (INDEPENDENT_AMBULATORY_CARE_PROVIDER_SITE_OTHER): Payer: BC Managed Care – PPO

## 2012-05-24 ENCOUNTER — Telehealth: Payer: Self-pay | Admitting: Internal Medicine

## 2012-05-24 VITALS — BP 110/78 | HR 90 | Temp 98.6°F | Resp 16 | Wt 188.0 lb

## 2012-05-24 DIAGNOSIS — R197 Diarrhea, unspecified: Secondary | ICD-10-CM

## 2012-05-24 DIAGNOSIS — R10816 Epigastric abdominal tenderness: Secondary | ICD-10-CM | POA: Insufficient documentation

## 2012-05-24 LAB — CBC WITH DIFFERENTIAL/PLATELET
Basophils Absolute: 0 10*3/uL (ref 0.0–0.1)
Basophils Relative: 0.4 % (ref 0.0–3.0)
Eosinophils Absolute: 0.2 10*3/uL (ref 0.0–0.7)
Eosinophils Relative: 2.3 % (ref 0.0–5.0)
HCT: 40.1 % (ref 36.0–46.0)
Hemoglobin: 13.3 g/dL (ref 12.0–15.0)
Lymphocytes Relative: 38.1 % (ref 12.0–46.0)
Lymphs Abs: 2.8 10*3/uL (ref 0.7–4.0)
MCHC: 33.2 g/dL (ref 30.0–36.0)
MCV: 90.4 fl (ref 78.0–100.0)
Monocytes Absolute: 0.5 10*3/uL (ref 0.1–1.0)
Monocytes Relative: 6.5 % (ref 3.0–12.0)
Neutro Abs: 3.8 10*3/uL (ref 1.4–7.7)
Neutrophils Relative %: 52.7 % (ref 43.0–77.0)
Platelets: 262 10*3/uL (ref 150.0–400.0)
RBC: 4.43 Mil/uL (ref 3.87–5.11)
RDW: 13.2 % (ref 11.5–14.6)
WBC: 7.3 10*3/uL (ref 4.5–10.5)

## 2012-05-24 LAB — URINALYSIS, ROUTINE W REFLEX MICROSCOPIC
Bilirubin Urine: NEGATIVE
Ketones, ur: NEGATIVE
Nitrite: NEGATIVE
Specific Gravity, Urine: 1.01 (ref 1.000–1.030)
Total Protein, Urine: NEGATIVE
Urine Glucose: NEGATIVE
Urobilinogen, UA: 0.2 (ref 0.0–1.0)
pH: 7 (ref 5.0–8.0)

## 2012-05-24 LAB — COMPREHENSIVE METABOLIC PANEL
ALT: 21 U/L (ref 0–35)
AST: 20 U/L (ref 0–37)
Albumin: 4.3 g/dL (ref 3.5–5.2)
Alkaline Phosphatase: 73 U/L (ref 39–117)
BUN: 11 mg/dL (ref 6–23)
CO2: 30 mEq/L (ref 19–32)
Calcium: 9.4 mg/dL (ref 8.4–10.5)
Chloride: 102 mEq/L (ref 96–112)
Creatinine, Ser: 0.5 mg/dL (ref 0.4–1.2)
GFR: 140.95 mL/min (ref >60.00)
Glucose, Bld: 91 mg/dL (ref 70–99)
Potassium: 4 mEq/L (ref 3.5–5.1)
Sodium: 139 mEq/L (ref 135–145)
Total Bilirubin: 0.6 mg/dL (ref 0.3–1.2)
Total Protein: 7.6 g/dL (ref 6.0–8.3)

## 2012-05-24 LAB — AMYLASE: Amylase: 45 U/L (ref 27–131)

## 2012-05-24 LAB — LIPASE: Lipase: 27 U/L (ref 11.0–59.0)

## 2012-05-24 NOTE — Telephone Encounter (Signed)
Caller: Sybella/Patient; PCP: Oliver Barre; CB#: [phone]; ; ; Call regarding Diarrhea; 05/15/12 Finished 5 days' Antibx/Clindamycin for Nipple Infx; stopped it due to diarrhea.  Seen in Dr. Winferd Humphrey office 05/17/12 and did not restart the clindamycin.  States she started with nausea, new diarrhea 05/22/12.  Diarrhea has not cleared in 48 hours.  Has had nausea as well.  Mild crampy pain as well.  Per protocol, emergent symptoms denied; advised appt withiin 24 hours.  States was able to sched appt 1600 05/24/12 in office.

## 2012-05-24 NOTE — Patient Instructions (Signed)

## 2012-05-25 ENCOUNTER — Encounter: Payer: Self-pay | Admitting: Internal Medicine

## 2012-05-25 ENCOUNTER — Other Ambulatory Visit: Payer: BC Managed Care – PPO

## 2012-05-25 DIAGNOSIS — R10816 Epigastric abdominal tenderness: Secondary | ICD-10-CM

## 2012-05-25 DIAGNOSIS — R197 Diarrhea, unspecified: Secondary | ICD-10-CM

## 2012-05-25 LAB — TSH: TSH: 0.78 u[IU]/mL (ref 0.35–5.50)

## 2012-05-25 LAB — SEDIMENTATION RATE: Sed Rate: 21 mm/hr (ref 0–22)

## 2012-05-25 NOTE — Assessment & Plan Note (Signed)
I will check her labs to look for hepatitis, pancreatitis, abnormal lytes and/or CBC, uti, renal stones, etc. also am concerned about gallstones so I have ordered an u/s of her abd

## 2012-05-25 NOTE — Progress Notes (Signed)
Subjective:    Patient ID: Jody Walker, female    DOB: June 17, 1961, 51 y.o.   MRN: [id number]  Abdominal Pain This is a new problem. The current episode started in the past 7 days. The onset quality is gradual. The problem occurs intermittently. Duration: 3. The problem has been unchanged. The pain is located in the epigastric region. The pain is at a severity of 2/10. The pain is mild. The quality of the pain is a sensation of fullness. The abdominal pain does not radiate. Associated symptoms include diarrhea (about 2 loose BM's per day) and nausea. Pertinent negatives include no anorexia, arthralgias, belching, constipation, dysuria, fever, flatus, frequency, headaches, hematochezia, hematuria, melena, myalgias, vomiting or weight loss. The pain is aggravated by eating. The pain is relieved by nothing. She has tried proton pump inhibitors (she just completed a course of clindamycin for a post-op infection) for the symptoms. The treatment provided no relief. Prior diagnostic workup includes upper endoscopy. Her past medical history is significant for GERD and irritable bowel syndrome.      Review of Systems  Constitutional: Negative for fever, chills, weight loss, diaphoresis, activity change, appetite change, fatigue and unexpected weight change.  HENT: Negative.   Eyes: Negative.   Respiratory: Negative for cough, chest tightness, shortness of breath, wheezing and stridor.   Cardiovascular: Negative for chest pain, palpitations and leg swelling.  Gastrointestinal: Positive for nausea, abdominal pain and diarrhea (about 2 loose BM's per day). Negative for vomiting, constipation, blood in stool, melena, hematochezia, abdominal distention, anal bleeding, rectal pain, anorexia and flatus.  Genitourinary: Negative for dysuria, urgency, frequency, hematuria, flank pain, decreased urine volume, vaginal bleeding, vaginal discharge, enuresis, difficulty urinating, genital sores, menstrual problem,  pelvic pain and dyspareunia.  Musculoskeletal: Negative for myalgias, back pain, joint swelling, arthralgias and gait problem.  Skin: Negative.   Neurological: Negative.  Negative for headaches.  Hematological: Negative for adenopathy. Does not bruise/bleed easily.  Psychiatric/Behavioral: Negative.        Objective:   Physical Exam  Vitals reviewed. Constitutional: She is oriented to person, place, and time. She appears well-developed and well-nourished.  Non-toxic appearance. She does not have a sickly appearance. She does not appear ill. No distress.  HENT:  Head: Normocephalic and atraumatic.  Mouth/Throat: Oropharynx is clear and moist. No oropharyngeal exudate.  Eyes: Conjunctivae are normal. Right eye exhibits no discharge. Left eye exhibits no discharge. No scleral icterus.  Neck: Normal range of motion. Neck supple. No JVD present. No tracheal deviation present. No thyromegaly present.  Cardiovascular: Normal rate, regular rhythm, normal heart sounds and intact distal pulses.  Exam reveals no gallop and no friction rub.   No murmur heard. Pulmonary/Chest: Effort normal and breath sounds normal. No stridor. No respiratory distress. She has no wheezes. She has no rales. She exhibits no tenderness.  Abdominal: Soft. Normal appearance and bowel sounds are normal. She exhibits no shifting dullness, no distension, no pulsatile liver, no fluid wave, no abdominal bruit, no ascites, no pulsatile midline mass and no mass. There is no hepatosplenomegaly. There is tenderness in the epigastric area. There is no rigidity, no rebound, no guarding, no CVA tenderness, no tenderness at McBurney's point and negative Murphy's sign. No hernia. Hernia confirmed negative in the ventral area.  Genitourinary: Rectum normal. Rectal exam shows no external hemorrhoid, no internal hemorrhoid, no fissure, no mass, no tenderness and anal tone normal. Guaiac negative stool.  Musculoskeletal: Normal range of motion.  She exhibits no edema and no tenderness.  Lymphadenopathy:    She has no cervical adenopathy.  Neurological: She is oriented to person, place, and time.  Skin: Skin is warm and dry. No rash noted. She is not diaphoretic. No erythema. No pallor.  Psychiatric: She has a normal mood and affect. Her behavior is normal. Judgment and thought content normal.      Lab Results  Component Value Date   WBC 6.4 08/25/2010   HGB 12.3 08/25/2010   HCT 35.8* 08/25/2010   PLT 243.0 08/25/2010   GLUCOSE 87 08/25/2010   CHOL 221* 08/25/2010   TRIG 58.0 08/25/2010   HDL 51.30 08/25/2010   LDLDIRECT 154.5 08/25/2010   ALT 29 08/25/2010   AST 24 08/25/2010   NA 143 08/25/2010   K 4.6 08/25/2010   CL 108 08/25/2010   CREATININE 0.6 08/25/2010   BUN 19 08/25/2010   CO2 30 08/25/2010   TSH 0.80 08/25/2010      Assessment & Plan:

## 2012-05-25 NOTE — Assessment & Plan Note (Signed)
She recently took clindamycin and so AAD is very likely but am also concerned about c diff infection so I have asked her to submit stool specimens, I will check her CBC to look for a high WBC and an ESR to look for an inflammatory bowel condition

## 2012-05-26 LAB — CLOSTRIDIUM DIFFICILE EIA: CDIFTX: NEGATIVE

## 2012-05-26 LAB — FECAL LACTOFERRIN, QUANT: Lactoferrin: NEGATIVE

## 2012-05-29 ENCOUNTER — Encounter: Payer: Self-pay | Admitting: Internal Medicine

## 2012-05-29 ENCOUNTER — Ambulatory Visit (INDEPENDENT_AMBULATORY_CARE_PROVIDER_SITE_OTHER): Payer: BC Managed Care – PPO | Admitting: Internal Medicine

## 2012-05-29 VITALS — BP 98/64 | HR 82 | Temp 97.7°F | Resp 18 | Wt 194.0 lb

## 2012-05-29 DIAGNOSIS — R10816 Epigastric abdominal tenderness: Secondary | ICD-10-CM

## 2012-05-29 DIAGNOSIS — R197 Diarrhea, unspecified: Secondary | ICD-10-CM

## 2012-05-29 NOTE — Patient Instructions (Addendum)
Chronic Diarrhea Diarrhea is loose, watery stools. Having diarrhea means passing loose stools 3 or more times a day. Diarrhea that lasts longer than 4 weeks is considered long-lasting (chronic). Symptoms of chronic diarrhea may be continual or may come and go. People of all ages can get diarrhea. Body fluid loss (dehydration) may occur as a result of diarrhea. This means the body does not have as many fluids and salts (electrolytes) as it needs. CAUSES  There are many causes of chronic diarrhea. Causes may be different for children and adults. The various causes can be grouped into 2 categories: diarrhea caused by an infection and diarrhea not caused by an infection. Sometimes, the cause is unknown. Diarrhea caused by an infection may result from:  Parasites.   Bacteria.   Viral infections.  Diarrhea not caused by an infection may result from:  Irritable bowel syndrome.   Reaction to medicines, such as antibiotics, cancer drugs, blood pressure medicines, and antacids.   Intestinal disease (Crohn's disease, ulcerative colitis, celiac disease).   Food allergies or sensitivity to additives (fructose, lactose, sugar substitutes).   Tumors.   Diabetes, thyroid disease, and other endocrine diseases.   Reduced blood flow to the intestine.   Previous surgery or radiation of the abdomen or gastrointestinal tract.  Risk factors for chronic diarrhea include:  Having a severely weakened immune system, such as from HIV/AIDS.   Taking certain types of cancer-fighting drugs (chemotherapy) or other medicines.   A recent organ transplant.   Having a portion of the stomach removed.   Traveling to countries where food and water supplies are often contaminated.  SYMPTOMS  In addition to frequent, loose stools, diarrhea may cause:  Cramping.   Abdominal pain.   Nausea.   Urgent need to use the bathroom, or loss of bowel control.  If dehydration occurs, problems include:  Thirst.    Less frequent urination.   Dark urine.   Dry skin.   Fatigue.   Dizziness.  Infections that cause diarrhea may also cause a fever, chills, or bloody stools. DIAGNOSIS  Diagnosis may be difficult. Your caregiver must take a careful history and perform a physical exam. Tests given are based on your symptoms and history. Tests may include:  Blood or stool tests, in which 3 or more stool samples may be examined. Stool cultures may be used to test for bacteria or parasites.   X-rays.   A procedure in which a thin tube is inserted into the mouth or rectum (endoscopy). This allows the caregiver to look inside the intestine.  TREATMENT   Diarrhea caused by an infection can often be treated with antibiotics.   Diarrhea not caused by an infection is more difficult to diagnose and treat. Long-term medicine use or surgery may be required. Specific treatment should be discussed with your caregiver.   If the cause cannot determined, treatment to relieve symptoms includes:   Preventing dehydration. Serious health problems can occur if you do not maintain proper fluid levels. Many oral rehydration solutions (ORS) are available at drug stores. Ask your caregiver what product is best for you.   Not drinking beverages that contain caffeine (tea, coffee, soft drinks).   Not drinking alcohol. It causes dehydration.   Not relying on sports drinks and broths alone to maintain proper fluid levels. They should not be used to prevent severe dehydration.   Maintaining well-balanced nutrition. This may help you recover faster.  PREVENTION   Drink clean or purified water.   Use proper   food handling techniques.   Maintain proper hand-washing habits.  HOME CARE INSTRUCTIONS   Avoid:   Caffeine.   Greasy foods.   High fiber.   If you have problems digesting lactose during or after an episode of diarrhea, you might want to try yogurt. Yogurt is often better tolerated, because it has less  lactose than milk. Yogurt with active, live bacterial cultures may even help you recover faster.  SEEK MEDICAL CARE IF:  The person with diarrhea is an otherwise healthy adult and has:  Signs of dehydration.   Diarrhea for more than 2 days.   Severe pain in the abdomen or rectum.   An oral temperature above 102 F (38.9 C).   Stools containing blood or pus.   Stools that are black and tarry.  SEEK IMMEDIATE MEDICAL CARE IF:  The person with diarrhea is a child, elderly person, or has a weakened immune system and has:  Signs of dehydration.   Diarrhea for more than 1 day.   Severe pain in the abdomen or rectum.   An oral temperature above 102 F (38.9 C), not controlled by medicine.   Stools containing blood or pus.   Stools that are black and tarry.  Document Released: 02/05/2004 Document Revised: 11/04/2011 Document Reviewed: 04/03/2010 [employer] Patient Information 2012 [employer]. 

## 2012-05-29 NOTE — Assessment & Plan Note (Signed)
This has resolved, I think she either had a flare of IBS or antibiotic associated diarrhea (clindamycin)

## 2012-05-29 NOTE — Assessment & Plan Note (Signed)
This has resolved.

## 2012-05-29 NOTE — Progress Notes (Signed)
  Subjective:    Patient ID: Jody Walker, female    DOB: 1961/08/15, 51 y.o.   MRN: [id number]  HPI She returns for f/up and tells me that her diarrhea and abd pain have resolved. She feels today and offers no complaints.   Review of Systems  Constitutional: Negative for fever, chills, diaphoresis, activity change, appetite change, fatigue and unexpected weight change.  HENT: Negative.   Eyes: Negative.   Respiratory: Negative for cough, chest tightness, shortness of breath, wheezing and stridor.   Cardiovascular: Negative for chest pain, palpitations and leg swelling.  Gastrointestinal: Negative for nausea, vomiting, abdominal pain, diarrhea, constipation, blood in stool and anal bleeding.  Genitourinary: Negative.   Musculoskeletal: Negative.   Skin: Negative.   Neurological: Negative.   Hematological: Negative for adenopathy. Does not bruise/bleed easily.  Psychiatric/Behavioral: Negative.        Objective:   Physical Exam  Vitals reviewed. Constitutional: She is oriented to person, place, and time. She appears well-developed and well-nourished. No distress.  HENT:  Head: Normocephalic and atraumatic.  Mouth/Throat: Oropharynx is clear and moist. No oropharyngeal exudate.  Eyes: Conjunctivae are normal. Right eye exhibits no discharge. Left eye exhibits no discharge. No scleral icterus.  Neck: Normal range of motion. Neck supple. No JVD present. No tracheal deviation present. No thyromegaly present.  Cardiovascular: Normal rate, regular rhythm, normal heart sounds and intact distal pulses.  Exam reveals no gallop and no friction rub.   No murmur heard. Pulmonary/Chest: Effort normal and breath sounds normal. No stridor. No respiratory distress. She has no wheezes. She has no rales. She exhibits no tenderness.  Abdominal: Soft. Bowel sounds are normal. She exhibits no distension and no mass. There is no tenderness. There is no rebound and no guarding.  Musculoskeletal: Normal  range of motion. She exhibits no edema and no tenderness.  Lymphadenopathy:    She has no cervical adenopathy.  Neurological: She is oriented to person, place, and time.  Skin: Skin is warm and dry. No rash noted. She is not diaphoretic. No erythema. No pallor.  Psychiatric: She has a normal mood and affect. Her behavior is normal. Judgment and thought content normal.     Lab Results  Component Value Date   WBC 7.3 05/24/2012   HGB 13.3 05/24/2012   HCT 40.1 05/24/2012   PLT 262.0 05/24/2012   GLUCOSE 91 05/24/2012   CHOL 221* 08/25/2010   TRIG 58.0 08/25/2010   HDL 51.30 08/25/2010   LDLDIRECT 154.5 08/25/2010   ALT 21 05/24/2012   AST 20 05/24/2012   NA 139 05/24/2012   K 4.0 05/24/2012   CL 102 05/24/2012   CREATININE 0.5 05/24/2012   BUN 11 05/24/2012   CO2 30 05/24/2012   TSH 0.78 05/24/2012       Assessment & Plan:

## 2012-05-31 ENCOUNTER — Other Ambulatory Visit: Payer: BC Managed Care – PPO

## 2012-11-20 ENCOUNTER — Telehealth: Payer: Self-pay | Admitting: Internal Medicine

## 2012-11-20 NOTE — Telephone Encounter (Signed)
Forward 3 pages from [facility] to Dr. Oliver Barre for review on 11-20-12 ym

## 2013-09-19 ENCOUNTER — Encounter (HOSPITAL_COMMUNITY): Payer: Self-pay | Admitting: Emergency Medicine

## 2013-09-19 ENCOUNTER — Emergency Department (HOSPITAL_COMMUNITY)
Admission: EM | Admit: 2013-09-19 | Discharge: 2013-09-19 | Disposition: A | Discharge status: Home / Self Care | Payer: 59 | Source: Home / Self Care | Attending: Emergency Medicine | Admitting: Emergency Medicine

## 2013-09-19 DIAGNOSIS — M5432 Sciatica, left side: Secondary | ICD-10-CM

## 2013-09-19 DIAGNOSIS — M5430 Sciatica, unspecified side: Secondary | ICD-10-CM

## 2013-09-19 LAB — POCT URINALYSIS DIP (DEVICE)
Bilirubin Urine: NEGATIVE
Glucose, UA: NEGATIVE mg/dL
Ketones, ur: NEGATIVE mg/dL
Leukocytes, UA: NEGATIVE
Nitrite: POSITIVE — AB
Protein, ur: NEGATIVE mg/dL
Specific Gravity, Urine: 1.025 (ref 1.005–1.030)
Urobilinogen, UA: 0.2 mg/dL (ref 0.0–1.0)
pH: 6.5 (ref 5.0–8.0)

## 2013-09-19 MED ORDER — KETOROLAC TROMETHAMINE 60 MG/2ML IM SOLN
INTRAMUSCULAR | Status: AC
  Filled 2013-09-19: qty 2

## 2013-09-19 MED ORDER — METHOCARBAMOL 500 MG PO TABS: 500.0000 mg | ORAL_TABLET | Freq: Three times a day (TID) | ORAL | Status: DC

## 2013-09-19 MED ORDER — KETOROLAC TROMETHAMINE 60 MG/2ML IM SOLN
60.0000 mg | Freq: Once | INTRAMUSCULAR | Status: AC
  Administered 2013-09-19: 60 mg via INTRAMUSCULAR

## 2013-09-19 MED ORDER — MELOXICAM 15 MG PO TABS: 15.0000 mg | ORAL_TABLET | Freq: Every day | ORAL | Status: DC

## 2013-09-19 MED ORDER — OXYCODONE-ACETAMINOPHEN 5-325 MG PO TABS: ORAL_TABLET | ORAL | Status: DC

## 2013-09-19 NOTE — ED Notes (Signed)
Pt c/o lower back pain onset this am... Reports last night she was fine Denies inj/trauma, urinary sxs, f/v/d Recalls lifting heavy objects Sunday but was fine Monday and Tuesday Hx of herniated discs, sciatica, kidney stones, fibromyalgia  Pt ambulated to exam room w/a slow steady gait Alert w/no signs of acute distress.

## 2013-09-19 NOTE — ED Provider Notes (Signed)
Chief Complaint:   Chief Complaint  Patient presents with  . 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 This past Sunday, 4 days ago she was doing a lot of heavy lifting but yet her back did not bother her at that time. This morning she just stepped the wrong way and experienced sudden onset of severe pain in her left lower back with radiation down the entire leg into the foot with numbness and tingling in the leg. The leg felt weak but she is able walk. It hurts her worse to walk. She denies any bladder or bowel dysfunction. There is no saddle anesthesia. No abdominal pain. No fever, headache, stiff neck, fever, chills, unintended weight loss. She's had no prior history of back problems or HNP in the past.  Review of Systems:  Other than noted above, the patient denies any of the following symptoms: Systemic:  No fever, chills, severe fatigue, or unexplained weight loss. GI:  No abdominal pain, nausea, vomiting, diarrhea, constipation, incontinence of bowel, or blood in stool. GU:  No dysuria, frequency, urgency, or hematuria. No incontinence of urine or difficulty urinating.  M-S:  No neck pain, joint pain, arthritis, or myalgias. Neuro:  No paresthesias, saddle anesthesia, muscular weakness, or progressive neurological deficit.  PMFSH:  Past medical history, family history, social history, meds, and allergies were reviewed. Specifically, there is no history of cancer, major trauma, osteoporosis, immunosuppression, or HIV infection.   Physical Exam:   Vital signs:  BP 143/75  Pulse 88  Temp(Src) 97.7 F (36.5 C) (Oral)  Resp 18  SpO2 100% General:  Alert, oriented, in no distress. Abdomen:  Soft, non-tender.  No organomegaly or mass.  No pulsatile midline abdominal mass or bruit. Back:  There is tenderness to the lower lumbar spine on the left, just to the left of the  midline radiating down towards his sacroiliac joint and towards the hip. Her back has 45 of flexion, 10 of lateral bending in each direction, 10 of extension, and 45 of rotation with pain. Straight leg raising is positive on the left with a positive Lasegue's sign and positive popliteal compression. Straight leg raising was negative on the right. Neuro:  Normal muscle strength, sensations. Her left knee reflex was absent, right knee reflex was 2+. Ankle reflexes were 0 bilaterally. Extremities: Pedal pulses were full, there was no edema. Skin:  Clear, warm and dry.  No rash.  Course in Urgent Care Center:   Given Toradol 60 mg IM since she will be driving home.  Assessment:  The encounter diagnosis was Sciatica, left.  She'll need followup with orthopedics as soon as possible.  Plan:   1.  Meds:  The following meds were prescribed:   Discharge Medication List as of 09/19/2013 10:16 AM    START taking these medications   Details  meloxicam (MOBIC) 15 MG tablet Take 1 tablet (15 mg total) by mouth daily., Starting 09/19/2013, Until Discontinued, Normal    methocarbamol (ROBAXIN) 500 MG tablet Take 1 tablet (500 mg total) by mouth 3 (three) times daily., Starting 09/19/2013, Until Discontinued, Normal    oxyCODONE-acetaminophen (PERCOCET) 5-325 MG per tablet 1 to 2 tablets every 6 hours as needed for pain., Print        2.  Patient Education/Counseling:  The patient was given appropriate handouts, self care instructions, and instructed in symptomatic  relief. The patient was encouraged to try to be as active as possible and given some exercises to do followed by moist heat.  3.  Follow up:  The patient was told to follow up if no better in 3 to 4 days, if becoming worse in any way, and given some red flag symptoms such as new neurological symptoms which would prompt immediate return.  Follow up with Dr. Lunette Stands at [facility] orthopedics within the next 2 or 3 days.     Reuben Likes, MD 09/19/13 1226

## 2013-09-25 ENCOUNTER — Other Ambulatory Visit (HOSPITAL_COMMUNITY): Payer: Self-pay | Admitting: Orthopedic Surgery

## 2013-09-25 DIAGNOSIS — R200 Anesthesia of skin: Secondary | ICD-10-CM

## 2013-09-25 DIAGNOSIS — M79605 Pain in left leg: Secondary | ICD-10-CM

## 2013-09-26 ENCOUNTER — Encounter: Payer: Self-pay | Admitting: Internal Medicine

## 2013-09-26 ENCOUNTER — Ambulatory Visit (HOSPITAL_COMMUNITY)
Admission: RE | Admit: 2013-09-26 | Discharge: 2013-09-26 | Disposition: A | Discharge status: Home / Self Care | Payer: 59 | Source: Ambulatory Visit | Attending: Orthopedic Surgery | Admitting: Orthopedic Surgery

## 2013-09-26 ENCOUNTER — Ambulatory Visit (INDEPENDENT_AMBULATORY_CARE_PROVIDER_SITE_OTHER): Payer: 59 | Admitting: Internal Medicine

## 2013-09-26 VITALS — BP 110/70 | HR 88 | Temp 99.2°F | Ht 63.5 in | Wt 206.4 lb

## 2013-09-26 DIAGNOSIS — M545 Low back pain, unspecified: Secondary | ICD-10-CM | POA: Insufficient documentation

## 2013-09-26 DIAGNOSIS — Z8542 Personal history of malignant neoplasm of other parts of uterus: Secondary | ICD-10-CM | POA: Insufficient documentation

## 2013-09-26 DIAGNOSIS — R200 Anesthesia of skin: Secondary | ICD-10-CM

## 2013-09-26 DIAGNOSIS — Z0000 Encounter for general adult medical examination without abnormal findings: Secondary | ICD-10-CM

## 2013-09-26 DIAGNOSIS — M5126 Other intervertebral disc displacement, lumbar region: Secondary | ICD-10-CM | POA: Insufficient documentation

## 2013-09-26 DIAGNOSIS — Z853 Personal history of malignant neoplasm of breast: Secondary | ICD-10-CM | POA: Insufficient documentation

## 2013-09-26 DIAGNOSIS — J019 Acute sinusitis, unspecified: Secondary | ICD-10-CM

## 2013-09-26 DIAGNOSIS — M79605 Pain in left leg: Secondary | ICD-10-CM

## 2013-09-26 MED ORDER — AZITHROMYCIN 250 MG PO TABS: ORAL_TABLET | ORAL | Status: DC

## 2013-09-26 MED ORDER — GADOBENATE DIMEGLUMINE 529 MG/ML IV SOLN
20.0000 mL | Freq: Once | INTRAVENOUS | Status: AC
  Administered 2013-09-26: 20 mL via INTRAVENOUS

## 2013-09-26 NOTE — Progress Notes (Signed)
Subjective:    Patient ID: Jody Walker, female    DOB: 01-26-1961, 52 y.o.   MRN: [id number]  HPI  Here for wellness and f/u;  Overall doing ok;  Pt denies CP, worsening SOB, DOE, wheezing, orthopnea, PND, worsening LE edema, palpitations, dizziness or syncope.  Pt denies neurological change such as new headache, facial or extremity weakness.  Pt denies polydipsia, polyuria, or low sugar symptoms. Pt states overall good compliance with treatment and medications, good tolerability, and has been trying to follow lower cholesterol diet.  Pt denies worsening depressive symptoms, suicidal ideation or panic. No fever, night sweats, wt loss, loss of appetite, or other constitutional symptoms.  Pt states good ability with ADL's, has low fall risk, home safety reviewed and adequate, no other significant changes in hearing or vision, and only occasionally active with exercise.  Also  Here with 2-3 days acute onset fever, facial pain, pressure, headache, general weakness and malaise, and greenish d/c, with mild ST and cough Past Medical History  Diagnosis Date  . Depression   . Anxiety   . Allergy   . Hyperlipidemia   . GERD (gastroesophageal reflux disease)   . History of nephrolithiasis   . IBS (irritable bowel syndrome)   . RLS (restless legs syndrome)   . DJD (degenerative joint disease)     Left foot  . DDD (degenerative disc disease), lumbar   . Herniated disc   . Sciatica     recurrent left  . Cervical herniated disc   . Fibromyalgia    Past Surgical History  Procedure Laterality Date  . Tubal ligation    . Cesarean section    . Left oophorectomy    . Tonsillectomy    . S/p skin cancer- "pre-melanoma"- right ear    . S/p basket extraction renal stone  1990's  . Abdominal hysterectomy    . Mastectomy      Double    reports that she has quit smoking. She has never used smokeless tobacco. She reports that she does not drink alcohol or use illicit drugs. family history includes Asthma  in her other; Cancer in her other; Heart disease in her father; Hyperlipidemia in her father; Hypertension in her mother. Allergies  Allergen Reactions  . Hydrocodone-Acetaminophen     REACTION: heart palpations  . Levofloxacin   . Other     White Steri Strips-Rash  . Procaine Hcl     REACTION: heart palpations  . Keflex [Cephalexin] Rash   Current Outpatient Prescriptions on File Prior to Visit  Medication Sig Dispense Refill  . fluticasone (FLONASE) 50 MCG/ACT nasal spray Place 2 sprays into the nose daily.      . meloxicam (MOBIC) 15 MG tablet Take 1 tablet (15 mg total) by mouth daily.  15 tablet  0  . methocarbamol (ROBAXIN) 500 MG tablet Take 1 tablet (500 mg total) by mouth 3 (three) times daily.  30 tablet  0  . Olopatadine HCl (PATANASE NA) Place into the nose.      Marland Kitchen omeprazole (PRILOSEC) 20 MG capsule Take 20 mg by mouth daily. 1-2 QD prn      . oxyCODONE-acetaminophen (PERCOCET) 5-325 MG per tablet 1 to 2 tablets every 6 hours as needed for pain.  20 tablet  0   No current facility-administered medications on file prior to visit.   Review of Systems Constitutional: Negative for diaphoresis, activity change, appetite change or unexpected weight change.  HENT: Negative for hearing loss, ear pain, facial  swelling, mouth sores and neck stiffness.   Eyes: Negative for pain, redness and visual disturbance.  Respiratory: Negative for shortness of breath and wheezing.   Cardiovascular: Negative for chest pain and palpitations.  Gastrointestinal: Negative for diarrhea, blood in stool, abdominal distention or other pain Genitourinary: Negative for hematuria, flank pain or change in urine volume.  Musculoskeletal: Negative for myalgias and joint swelling.  Skin: Negative for color change and wound.  Neurological: Negative for syncope and numbness. other than noted Hematological: Negative for adenopathy.  Psychiatric/Behavioral: Negative for hallucinations, self-injury, decreased  concentration and agitation.      Objective:   Physical Exam BP 110/70  Pulse 88  Temp(Src) 99.2 F (37.3 C) (Oral)  Ht 5' 3.5" (1.613 m)  Wt 206 lb 6 oz (93.611 kg)  BMI 35.98 kg/m2  SpO2 95% VS noted, mild ill Constitutional: Pt is oriented to person, place, and time. Appears well-developed and well-nourished.  Head: Normocephalic and atraumatic.  Right Ear: External ear normal.  Left Ear: External ear normal.  Nose: Nose normal.  Mouth/Throat: Oropharynx is clear and moist.  Bilat tm's with mild erythema.  Max sinus areas mild tender.  Pharynx with mild erythema, no exudate Eyes: Conjunctivae and EOM are normal. Pupils are equal, round, and reactive to light.  Neck: Normal range of motion. Neck supple. No JVD present. No tracheal deviation present.  Cardiovascular: Normal rate, regular rhythm, normal heart sounds and intact distal pulses.   Pulmonary/Chest: Effort normal and breath sounds normal.  Abdominal: Soft. Bowel sounds are normal. There is no tenderness. No HSM  Musculoskeletal: Normal range of motion. Exhibits no edema.  Lymphadenopathy:  Has no cervical adenopathy.  Neurological: Pt is alert and oriented to person, place, and time. Pt has normal reflexes. No cranial nerve deficit.  Skin: Skin is warm and dry. No rash noted.  Psychiatric:  Has  normal mood and affect. Behavior is normal.     Assessment & Plan:

## 2013-09-26 NOTE — Patient Instructions (Signed)
Please take all new medication as prescribed Please continue all other medications as before, and refills have been done if requested. Please have the pharmacy call with any other refills you may need. Please continue your efforts at being more active, low cholesterol diet, and weight control. You are otherwise up to date with prevention measures today. Please keep your appointments with your specialists as you may have planned  Please go to the LAB in the Basement (turn left off the elevator) for the tests to be done today You will be contacted by phone if any changes need to be made immediately.  Otherwise, you will receive a letter about your results with an explanation, but please check with MyChart first.  Please remember to sign up for My Chart if you have not done so, as this will be important to you in the future with finding out test results, communicating by private email, and scheduling acute appointments online when needed.  Please return in 1 year for your yearly visit, or sooner if needed

## 2013-09-26 NOTE — Assessment & Plan Note (Signed)

## 2013-09-26 NOTE — Assessment & Plan Note (Signed)
Mild to mod, for antibx course,  to f/u any worsening symptoms or concerns 

## 2013-09-27 ENCOUNTER — Other Ambulatory Visit (INDEPENDENT_AMBULATORY_CARE_PROVIDER_SITE_OTHER): Payer: 59

## 2013-09-27 ENCOUNTER — Encounter: Payer: Self-pay | Admitting: Internal Medicine

## 2013-09-27 DIAGNOSIS — Z0000 Encounter for general adult medical examination without abnormal findings: Secondary | ICD-10-CM

## 2013-09-27 LAB — LIPID PANEL
Cholesterol: 175 mg/dL (ref 0–200)
HDL: 44.6 mg/dL (ref >39.00)
LDL Cholesterol: 115 mg/dL — ABNORMAL HIGH (ref 0–99)
Total CHOL/HDL Ratio: 4
Triglycerides: 75 mg/dL (ref 0.0–149.0)
VLDL: 15 mg/dL (ref 0.0–40.0)

## 2013-09-27 LAB — CBC WITH DIFFERENTIAL/PLATELET
Basophils Absolute: 0 10*3/uL (ref 0.0–0.1)
Basophils Relative: 0.4 % (ref 0.0–3.0)
Eosinophils Absolute: 0.1 10*3/uL (ref 0.0–0.7)
Eosinophils Relative: 1.5 % (ref 0.0–5.0)
HCT: 38.7 % (ref 36.0–46.0)
Hemoglobin: 13.1 g/dL (ref 12.0–15.0)
Lymphocytes Relative: 28.6 % (ref 12.0–46.0)
Lymphs Abs: 2 10*3/uL (ref 0.7–4.0)
MCHC: 34 g/dL (ref 30.0–36.0)
MCV: 88.2 fl (ref 78.0–100.0)
Monocytes Absolute: 0.4 10*3/uL (ref 0.1–1.0)
Monocytes Relative: 5.7 % (ref 3.0–12.0)
Neutro Abs: 4.5 10*3/uL (ref 1.4–7.7)
Neutrophils Relative %: 63.8 % (ref 43.0–77.0)
Platelets: 249 10*3/uL (ref 150.0–400.0)
RBC: 4.39 Mil/uL (ref 3.87–5.11)
RDW: 12.7 % (ref 11.5–14.6)
WBC: 7 10*3/uL (ref 4.5–10.5)

## 2013-09-27 LAB — HEPATIC FUNCTION PANEL
ALT: 27 U/L (ref 0–35)
AST: 20 U/L (ref 0–37)
Albumin: 3.9 g/dL (ref 3.5–5.2)
Alkaline Phosphatase: 66 U/L (ref 39–117)
Bilirubin, Direct: 0.1 mg/dL (ref 0.0–0.3)
Total Bilirubin: 0.6 mg/dL (ref 0.3–1.2)
Total Protein: 7 g/dL (ref 6.0–8.3)

## 2013-09-27 LAB — BASIC METABOLIC PANEL
BUN: 11 mg/dL (ref 6–23)
CO2: 28 mEq/L (ref 19–32)
Calcium: 9.1 mg/dL (ref 8.4–10.5)
Chloride: 106 mEq/L (ref 96–112)
Creatinine, Ser: 0.5 mg/dL (ref 0.4–1.2)
GFR: 140.22 mL/min (ref >60.00)
Glucose, Bld: 94 mg/dL (ref 70–99)
Potassium: 4.2 mEq/L (ref 3.5–5.1)
Sodium: 142 mEq/L (ref 135–145)

## 2013-09-27 LAB — URINALYSIS, ROUTINE W REFLEX MICROSCOPIC
Bilirubin Urine: NEGATIVE
Ketones, ur: NEGATIVE
Leukocytes, UA: NEGATIVE
Nitrite: NEGATIVE
Specific Gravity, Urine: 1.015 (ref 1.000–1.030)
Total Protein, Urine: NEGATIVE
Urine Glucose: NEGATIVE
Urobilinogen, UA: 0.2 (ref 0.0–1.0)
pH: 8 (ref 5.0–8.0)

## 2013-09-27 LAB — TSH: TSH: 0.4 u[IU]/mL (ref 0.35–5.50)

## 2014-04-10 DIAGNOSIS — K429 Umbilical hernia without obstruction or gangrene: Secondary | ICD-10-CM | POA: Insufficient documentation

## 2014-10-31 DIAGNOSIS — K432 Incisional hernia without obstruction or gangrene: Secondary | ICD-10-CM | POA: Insufficient documentation

## 2014-10-31 DIAGNOSIS — R1013 Epigastric pain: Secondary | ICD-10-CM | POA: Insufficient documentation

## 2015-12-30 ENCOUNTER — Encounter (HOSPITAL_COMMUNITY): Payer: Self-pay | Admitting: Emergency Medicine

## 2015-12-30 ENCOUNTER — Emergency Department (HOSPITAL_COMMUNITY)
Admission: EM | Admit: 2015-12-30 | Discharge: 2015-12-30 | Disposition: A | Discharge status: Home / Self Care | Payer: Managed Care, Other (non HMO) | Attending: Emergency Medicine | Admitting: Emergency Medicine

## 2015-12-30 DIAGNOSIS — R42 Dizziness and giddiness: Secondary | ICD-10-CM | POA: Diagnosis present

## 2015-12-30 DIAGNOSIS — E785 Hyperlipidemia, unspecified: Secondary | ICD-10-CM | POA: Diagnosis not present

## 2015-12-30 DIAGNOSIS — Z79899 Other long term (current) drug therapy: Secondary | ICD-10-CM | POA: Diagnosis not present

## 2015-12-30 DIAGNOSIS — H8110 Benign paroxysmal vertigo, unspecified ear: Secondary | ICD-10-CM | POA: Diagnosis not present

## 2015-12-30 DIAGNOSIS — Z7951 Long term (current) use of inhaled steroids: Secondary | ICD-10-CM | POA: Diagnosis not present

## 2015-12-30 DIAGNOSIS — K219 Gastro-esophageal reflux disease without esophagitis: Secondary | ICD-10-CM | POA: Insufficient documentation

## 2015-12-30 DIAGNOSIS — R110 Nausea: Secondary | ICD-10-CM | POA: Diagnosis not present

## 2015-12-30 DIAGNOSIS — F419 Anxiety disorder, unspecified: Secondary | ICD-10-CM | POA: Insufficient documentation

## 2015-12-30 DIAGNOSIS — F329 Major depressive disorder, single episode, unspecified: Secondary | ICD-10-CM | POA: Insufficient documentation

## 2015-12-30 DIAGNOSIS — Z87891 Personal history of nicotine dependence: Secondary | ICD-10-CM | POA: Insufficient documentation

## 2015-12-30 MED ORDER — MECLIZINE HCL 25 MG PO TABS
25.0000 mg | ORAL_TABLET | Freq: Once | ORAL | Status: AC
  Administered 2015-12-30: 25 mg via ORAL
  Filled 2015-12-30: qty 1

## 2015-12-30 MED ORDER — DIAZEPAM 5 MG PO TABS: 5.0000 mg | ORAL_TABLET | Freq: Two times a day (BID) | ORAL | Status: DC

## 2015-12-30 MED ORDER — DIPHENHYDRAMINE HCL 50 MG/ML IJ SOLN
25.0000 mg | Freq: Once | INTRAMUSCULAR | Status: AC
  Administered 2015-12-30: 25 mg via INTRAVENOUS
  Filled 2015-12-30: qty 1

## 2015-12-30 MED ORDER — DIAZEPAM 5 MG/ML IJ SOLN
5.0000 mg | Freq: Once | INTRAMUSCULAR | Status: AC
  Administered 2015-12-30: 5 mg via INTRAVENOUS
  Filled 2015-12-30: qty 2

## 2015-12-30 MED ORDER — ONDANSETRON 4 MG PO TBDP: 4.0000 mg | ORAL_TABLET | Freq: Three times a day (TID) | ORAL | Status: DC | PRN

## 2015-12-30 MED ORDER — MECLIZINE HCL 25 MG PO TABS: 25.0000 mg | ORAL_TABLET | Freq: Three times a day (TID) | ORAL | Status: DC | PRN

## 2015-12-30 MED ORDER — ONDANSETRON HCL 4 MG/2ML IJ SOLN
4.0000 mg | Freq: Once | INTRAMUSCULAR | Status: AC
  Administered 2015-12-30: 4 mg via INTRAVENOUS
  Filled 2015-12-30: qty 2

## 2015-12-30 NOTE — ED Notes (Signed)
Pt here via EMS after a dizzy episode at 1420 today while picking up her grandchildren from school. Pt reports similar episodes over the last 2 weeks. Pt more symptomatic when moving.

## 2015-12-30 NOTE — ED Notes (Signed)
PT ambulatory to restroom with moderate assistance.

## 2015-12-30 NOTE — ED Notes (Signed)
Pt ambulates independently and with steady gait at time of discharge. Discharge instructions and follow up information reviewed with patient. No other questions or concerns voiced at this time. RX x 3 Given

## 2015-12-30 NOTE — Discharge Instructions (Signed)
Benign Positional Vertigo Vertigo is the feeling that you or your surroundings are moving when they are not. Benign positional vertigo is the most common form of vertigo. The cause of this condition is not serious (is benign). This condition is triggered by certain movements and positions (is positional). This condition can be dangerous if it occurs while you are doing something that could endanger you or others, such as driving.  CAUSES In many cases, the cause of this condition is not known. It may be caused by a disturbance in an area of the inner ear that helps your brain to sense movement and balance. This disturbance can be caused by a viral infection (labyrinthitis), head injury, or repetitive motion. RISK FACTORS This condition is more likely to develop in:  Women.  People who are 50 years of age or older. SYMPTOMS Symptoms of this condition usually happen when you move your head or your eyes in different directions. Symptoms may start suddenly, and they usually last for less than a minute. Symptoms may include:  Loss of balance and falling.  Feeling like you are spinning or moving.  Feeling like your surroundings are spinning or moving.  Nausea and vomiting.  Blurred vision.  Dizziness.  Involuntary eye movement (nystagmus). Symptoms can be mild and cause only slight annoyance, or they can be severe and interfere with daily life. Episodes of benign positional vertigo may return (recur) over time, and they may be triggered by certain movements. Symptoms may improve over time. DIAGNOSIS This condition is usually diagnosed by medical history and a physical exam of the head, neck, and ears. You may be referred to a health care provider who specializes in ear, nose, and throat (ENT) problems (otolaryngologist) or a provider who specializes in disorders of the nervous system (neurologist). You may have additional testing, including:  MRI.  A CT scan.  Eye movement tests. Your  health care provider may ask you to change positions quickly while he or she watches you for symptoms of benign positional vertigo, such as nystagmus. Eye movement may be tested with an electronystagmogram (ENG), caloric stimulation, the Dix-Hallpike test, or the roll test.  An electroencephalogram (EEG). This records electrical activity in your brain.  Hearing tests. TREATMENT Usually, your health care provider will treat this by moving your head in specific positions to adjust your inner ear back to normal. Surgery may be needed in severe cases, but this is rare. In some cases, benign positional vertigo may resolve on its own in 2-4 weeks. HOME CARE INSTRUCTIONS Safety  Move slowly.Avoid sudden body or head movements.  Avoid driving.  Avoid operating heavy machinery.  Avoid doing any tasks that would be dangerous to you or others if a vertigo episode would occur.  If you have trouble walking or keeping your balance, try using a cane for stability. If you feel dizzy or unstable, sit down right away.  Return to your normal activities as told by your health care provider. Ask your health care provider what activities are safe for you. General Instructions  Take over-the-counter and prescription medicines only as told by your health care provider.  Avoid certain positions or movements as told by your health care provider.  Drink enough fluid to keep your urine clear or pale yellow.  Keep all follow-up visits as told by your health care provider. This is important. SEEK MEDICAL CARE IF:  You have a fever.  Your condition gets worse or you develop new symptoms.  Your family or friends   notice any behavioral changes.  Your nausea or vomiting gets worse.  You have numbness or a "pins and needles" sensation. SEEK IMMEDIATE MEDICAL CARE IF:  You have difficulty speaking or moving.  You are always dizzy.  You faint.  You develop severe headaches.  You have weakness in your  legs or arms.  You have changes in your hearing or vision.  You develop a stiff neck.  You develop sensitivity to light.   This information is not intended to replace advice given to you by your health care provider. Make sure you discuss any questions you have with your health care provider.   Document Released: 08/23/2006 Document Revised: 08/06/2015 Document Reviewed: 03/10/2015 Elsevier Interactive Patient Education 2016 [employer] Inc[employer]  

## 2015-12-30 NOTE — ED Provider Notes (Signed)
CSN: [id number]     Arrival date & time 12/30/15  66 History   First MD Initiated Contact with Patient 12/30/15 1603     Chief Complaint  Patient presents with  . Dizziness      HPI She presents for evaluation of dizziness. Today at 2:20 PM she was picking up her grandchildren from school. She was pushing a friend into the school with a wheelchair. She pushed her upper ramp. When she lifted her head to look upward she thought things were spinning around. Symptoms have recurred with movements. States that she sits with her eyes still she is asymptomatic per she's had episodes of vertigo in the past. However this with this when she became nauseated and more symptomatically and she had in the past and presents here.  Additional stroke symptoms no numbness or weakness. No headache. No recent cold symptoms. Nausea but no vomiting. No history of hypertension diabetes known heart or cerebrovascular disease.   Past Medical History  Diagnosis Date  . Depression   . Anxiety   . Allergy   . Hyperlipidemia   . GERD (gastroesophageal reflux disease)   . History of nephrolithiasis   . IBS (irritable bowel syndrome)   . RLS (restless legs syndrome)   . DJD (degenerative joint disease)     Left foot  . DDD (degenerative disc disease), lumbar   . Herniated disc   . Sciatica     recurrent left  . Cervical herniated disc   . Fibromyalgia    Past Surgical History  Procedure Laterality Date  . Tubal ligation    . Cesarean section    . Left oophorectomy    . Tonsillectomy    . S/p skin cancer- "pre-melanoma"- right ear    . S/p basket extraction renal stone  1990's  . Abdominal hysterectomy    . Mastectomy      Double   Family History  Problem Relation Age of Onset  . Hypertension Mother   . Heart disease Father   . Hyperlipidemia Father   . Asthma Other     father, grandmother  . Cancer Other     skin, breast, colon, uterine, renal   Social History  Substance Use Topics  .  Smoking status: Former Research scientist (life sciences)  . Smokeless tobacco: Never Used  . Alcohol Use: No     Comment: social   OB History    No data available     Review of Systems  Constitutional: Negative for fever, chills, diaphoresis, appetite change and fatigue.  HENT: Negative for mouth sores, sore throat and trouble swallowing.   Eyes: Negative for visual disturbance.  Respiratory: Negative for cough, chest tightness, shortness of breath and wheezing.   Cardiovascular: Negative for chest pain.  Gastrointestinal: Positive for nausea. Negative for vomiting, abdominal pain, diarrhea and abdominal distention.  Endocrine: Negative for polydipsia, polyphagia and polyuria.  Genitourinary: Negative for dysuria, frequency and hematuria.  Musculoskeletal: Negative for gait problem.  Skin: Negative for color change, pallor and rash.  Neurological: Positive for dizziness. Negative for syncope, light-headedness and headaches.  Hematological: Does not bruise/bleed easily.  Psychiatric/Behavioral: Negative for behavioral problems and confusion.      Allergies  Hydrocodone-acetaminophen; Levofloxacin; Other; Procaine hcl; and Keflex  Home Medications   Prior to Admission medications   Medication Sig Start Date End Date Taking? Authorizing Provider  fluticasone (FLONASE) 50 MCG/ACT nasal spray Place 2 sprays into the nose daily.   Yes Historical Provider, MD  omeprazole (PRILOSEC) 20 MG  capsule Take 20 mg by mouth daily.    Yes Historical Provider, MD  azithromycin (ZITHROMAX Z-PAK) 250 MG tablet Use as directed Patient not taking: Reported on 12/30/2015 09/26/13   Biagio Borg, MD  diazepam (VALIUM) 5 MG tablet Take 1 tablet (5 mg total) by mouth 2 (two) times daily. 12/30/15   Tanna Furry, MD  meclizine (ANTIVERT) 25 MG tablet Take 1 tablet (25 mg total) by mouth 3 (three) times daily as needed. 12/30/15   Tanna Furry, MD  meloxicam (MOBIC) 15 MG tablet Take 1 tablet (15 mg total) by mouth daily. Patient not  taking: Reported on 12/30/2015 09/19/13   Harden Mo, MD  methocarbamol (ROBAXIN) 500 MG tablet Take 1 tablet (500 mg total) by mouth 3 (three) times daily. Patient not taking: Reported on 12/30/2015 09/19/13   Harden Mo, MD  Olopatadine HCl (PATANASE NA) Place into the nose.    Historical Provider, MD  ondansetron (ZOFRAN ODT) 4 MG disintegrating tablet Take 1 tablet (4 mg total) by mouth every 8 (eight) hours as needed for nausea. 12/30/15   Tanna Furry, MD  oxyCODONE-acetaminophen (PERCOCET) 5-325 MG per tablet 1 to 2 tablets every 6 hours as needed for pain. Patient not taking: Reported on 12/30/2015 09/19/13   Harden Mo, MD   BP 143/67 mmHg  Pulse 101  Temp(Src) 98.5 F (36.9 C) (Oral)  Resp 20  Ht 5\' 3"  (1.6 m)  Wt 222 lb (100.699 kg)  BMI 39.34 kg/m2  SpO2 100% Physical Exam  Constitutional: She is oriented to person, place, and time. She appears well-developed and well-nourished. No distress.  HENT:  Head: Normocephalic.  Eyes: Conjunctivae are normal. Pupils are equal, round, and reactive to light. No scleral icterus.  Neck: Normal range of motion. Neck supple. No thyromegaly present.  Cardiovascular: Normal rate and regular rhythm.  Exam reveals no gallop and no friction rub.   No murmur heard. Pulmonary/Chest: Effort normal and breath sounds normal. No respiratory distress. She has no wheezes. She has no rales.  Abdominal: Soft. Bowel sounds are normal. She exhibits no distension. There is no tenderness. There is no rebound.  Musculoskeletal: Normal range of motion.  Neurological: She is alert and oriented to person, place, and time.  Inducible nystagmus with lateral gaze of her was sitting upright. No vertical nystagmus. No cranial nerve deficits. No pronator drift  Skin: Skin is warm and dry. No rash noted.  Psychiatric: She has a normal mood and affect. Her behavior is normal.    ED Course  Procedures (including critical care time) Labs Review Labs Reviewed  - No data to display  Imaging Review No results found. I have personally reviewed and evaluated these images and lab results as part of my medical decision-making.   EKG Interpretation   Date/Time:  Tuesday December 30 2015 16:05:24 EST Ventricular Rate:  95 PR Interval:  139 QRS Duration: 90 QT Interval:  352 QTC Calculation: 442 R Axis:   20 Text Interpretation:  Sinus rhythm Low voltage, precordial leads Baseline  wander in lead(s) I III aVL Confirmed by Jeneen Rinks  MD, [place] ([id number]) on  12/30/2015 4:34:56 PM      MDM   Final diagnoses:  Benign positional vertigo, unspecified laterality    Classic presentation for acute peripheral vertigo. Completely asymptomatic at rest. Only symptomatically with movement. Given meclizine by mouth, IV Benadryl, and IV Zofran. He was improved but did not resolve. Will give IV Valium. Lobster short time and plan DC  for symptomatically treatment with when necessary Valium, 3 times a day meclizine until 24 hours without symptoms.    Tanna Furry, MD 12/30/15 1754

## 2016-08-03 ENCOUNTER — Telehealth: Payer: Self-pay | Admitting: Internal Medicine

## 2016-08-03 NOTE — Telephone Encounter (Signed)
Rec'd from [facility] forward 3 pages to [place]

## 2016-08-27 ENCOUNTER — Telehealth: Payer: Self-pay | Admitting: Internal Medicine

## 2016-08-27 NOTE — Telephone Encounter (Signed)
Rec'd from [facility] forward 3 pages to [place]

## 2016-10-15 ENCOUNTER — Ambulatory Visit (INDEPENDENT_AMBULATORY_CARE_PROVIDER_SITE_OTHER): Payer: Managed Care, Other (non HMO) | Admitting: Physician Assistant

## 2016-10-15 ENCOUNTER — Encounter: Payer: Self-pay | Admitting: Physician Assistant

## 2016-10-15 VITALS — BP 122/82 | Temp 98.5°F | Resp 16 | Ht 63.75 in | Wt 220.0 lb

## 2016-10-15 DIAGNOSIS — F419 Anxiety disorder, unspecified: Secondary | ICD-10-CM

## 2016-10-15 DIAGNOSIS — F418 Other specified anxiety disorders: Secondary | ICD-10-CM | POA: Diagnosis not present

## 2016-10-15 DIAGNOSIS — H6981 Other specified disorders of Eustachian tube, right ear: Secondary | ICD-10-CM | POA: Diagnosis not present

## 2016-10-15 DIAGNOSIS — F329 Major depressive disorder, single episode, unspecified: Secondary | ICD-10-CM

## 2016-10-15 DIAGNOSIS — Z23 Encounter for immunization: Secondary | ICD-10-CM

## 2016-10-15 DIAGNOSIS — C49A0 Gastrointestinal stromal tumor, unspecified site: Secondary | ICD-10-CM

## 2016-10-15 DIAGNOSIS — H6991 Unspecified Eustachian tube disorder, right ear: Secondary | ICD-10-CM

## 2016-10-15 DIAGNOSIS — F32A Depression, unspecified: Secondary | ICD-10-CM

## 2016-10-15 MED ORDER — LORATADINE-PSEUDOEPHEDRINE ER 5-120 MG PO TB12: 1.0000 | ORAL_TABLET | Freq: Two times a day (BID) | ORAL | 0 refills | Status: DC

## 2016-10-15 MED ORDER — DIAZEPAM 2 MG PO TABS: 2.0000 mg | ORAL_TABLET | Freq: Two times a day (BID) | ORAL | 0 refills | Status: DC | PRN

## 2016-10-15 NOTE — Assessment & Plan Note (Signed)
Resume Flonase daily. Start Claritin-D. Saline nasal rinses. FU if not resolving.

## 2016-10-15 NOTE — Patient Instructions (Signed)
Please continue Flonase.  Star the Claritin-D as directed for a few days.  Use saline nasal spray. Call or return to clinic if symptoms are not improving.  Make sure to take time to take care of yourself. Use the handout given to schedule an appointment with one of our wonderful counselors. Take the Diazepam as directed only for acute anxiety.  Follow-up in 1 month.  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 Let me know if you have not heard anything by mid next week.

## 2016-10-15 NOTE — Assessment & Plan Note (Signed)
Recent diagnosis. Most likely benign per patient. No records for review at present. Will attempt to obtain. Patient wanting second opinion regarding treatment versus monitoring giving extensive personal and family hx of cancer. Referral placed to specialist at [facility].

## 2016-10-15 NOTE — Assessment & Plan Note (Addendum)
Due to recent stressors. No alarm signs/symptoms.  Rx diazepam for rare use. Counseling discussed. Patient is scheduling appointment. Handout given. FU scheduled. Patient counseled to return immediately or go to ER if she ever experiences thoughts of harming herself or others.

## 2016-10-15 NOTE — Progress Notes (Signed)
Pre visit review using our clinic review tool, if applicable. No additional management support is needed unless otherwise documented below in the visit note. 

## 2016-10-15 NOTE — Progress Notes (Signed)
Patient presents to clinic today to establish care.  Acute Concerns: Patient c/o 2 weeks of R ear pain and pressure. Endorses slight decrease in hearing. Denies drainage from ear or ear swelling. Denies other URI symptoms. Denies symptoms of L ear. Notes feeling slightly off-balance since symptom onset. Endorses history of seasonal allergies, previously on allergy shots. Has used Flonase from time to time since symptom onset.    Patient also with anxiety due to significant home stressors recently. Has remote history of anxiety and depression without current treatment. Patient notes just finding out one of her sons has a heroin addiction. He has moved in with her and her husband while going through treatment/counseling. She has also been taking care of his two children. Patient also with recent diagnosis of GIST tumor and has noted stress/anxiety about that. Denies change to sleep. Endorses depressed mood and anhedonia. Denies SI/HI.  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 Was sent to general surgery and was told they would like to monitor overt he past   Health Maintenance: Immunizations -- Would like flu shot today.  Colonoscopy -- yearly due to GIST and gastric polyps. Mammogram -- bilateral mastectomy  PAP -- s/p hysterectomy   Past Medical History:  Diagnosis Date  . Allergy   . Anxiety   . DDD (degenerative disc disease), lumbar   . Depression   . DJD (degenerative joint disease)    Left foot  . Fibromyalgia   . GERD (gastroesophageal reflux disease)   . GIST, non-malignant   . Herniated disc   . History of nephrolithiasis   . Hyperlipidemia   . IBS (irritable bowel syndrome)   . RLS (restless legs syndrome)   . Sciatica    recurrent left    Past Surgical History:  Procedure Laterality Date  . ABDOMINAL HYSTERECTOMY    . CESAREAN SECTION    . LEFT OOPHORECTOMY    . MASTECTOMY Bilateral    Double  . s/p basket extraction renal stone   1990's  . s/p skin cancer- "pre-melanoma"- right ear    . TONSILLECTOMY    . TUBAL LIGATION      Current Outpatient Prescriptions on File Prior to Visit  Medication Sig Dispense Refill  . fluticasone (FLONASE) 50 MCG/ACT nasal spray Place 2 sprays into the nose daily.    Marland Kitchen omeprazole (PRILOSEC) 20 MG capsule Take 20 mg by mouth daily.      No current facility-administered medications on file prior to visit.     Allergies  Allergen Reactions  . Hydrocodone-Acetaminophen     REACTION: heart palpations  . Levofloxacin Other (See Comments)    She had an outer body experience  . Other     White Steri Strips-Rash  . Keflex [Cephalexin] Rash    Family History  Problem Relation Age of Onset  . Hypertension Mother   . Stroke Mother   . Heart disease Father   . Hyperlipidemia Father   . Asthma Other     father, grandmother  . Cancer Other     skin, breast, colon, uterine, renal    Social History   Social History  . Marital status: Married    Spouse name: N/A  . Number of children: N/A  . Years of education: N/A   Occupational History  . Not on file.   Social History Main Topics  . Smoking status: Never Smoker  . Smokeless tobacco: Never Used  . Alcohol use No  .  Drug use: No  . Sexual activity: Not Currently   Other Topics Concern  . 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 All healthy   Review of Systems  Constitutional: Negative for fever and weight loss.  HENT: Positive for ear pain. Negative for congestion, ear discharge, hearing loss, sinus pain and tinnitus.   Eyes: Negative for blurred vision.  Respiratory: Negative for cough and shortness of breath.   Cardiovascular: Negative for chest pain and palpitations.  Genitourinary: Negative for dysuria, flank pain, frequency, hematuria and urgency.  Musculoskeletal: Negative for falls.  Neurological: Negative for dizziness, loss  of consciousness and headaches.  Endo/Heme/Allergies: Negative for environmental allergies.  Psychiatric/Behavioral: Positive for depression. Negative for hallucinations, substance abuse and suicidal ideas. The patient is nervous/anxious. The patient does not have insomnia.    BP 122/82   Temp 98.5 F (36.9 C) (Oral)   Resp 16   Ht 5' 3.75" (1.619 m)   Wt 220 lb (99.8 kg)   SpO2 98%   BMI 38.06 kg/m   Physical Exam  Constitutional: She is oriented to person, place, and time and well-developed, well-nourished, and in no distress.  HENT:  Head: Normocephalic and atraumatic.  Right Ear: Tympanic membrane is retracted. Tympanic membrane is not bulging. A middle ear effusion is present.  Left Ear: Tympanic membrane normal.  Nose: Nose normal.  Mouth/Throat: Uvula is midline, oropharynx is clear and moist and mucous membranes are normal.  Cardiovascular: Normal rate, regular rhythm, normal heart sounds and intact distal pulses.   Pulmonary/Chest: Effort normal and breath sounds normal. No respiratory distress. She has no wheezes. She has no rales. She exhibits no tenderness.  Neurological: She is alert and oriented to person, place, and time.  Skin: Skin is warm and dry. No rash noted.  Psychiatric: Affect normal.  Vitals reviewed.  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 Most likely benign per patient. No records for review at present. Will attempt to obtain. Patient wanting second opinion regarding treatment versus monitoring giving extensive personal and family hx of cancer. Referral placed to specialist at [facility] tube dysfunction, right Resume Flonase daily. Start Claritin-D. Saline nasal rinses. FU if not resolving.  Anxiety and depression Due to recent stressors. No alarm signs/symptoms.  Rx diazepam for rare use. Counseling discussed. Patient is scheduling appointment. Handout given. FU scheduled. Patient counseled to return  immediately or go to ER if she ever experiences thoughts of harming herself or others.     Leeanne Rio, PA-C

## 2016-10-27 DIAGNOSIS — D214 Benign neoplasm of connective and other soft tissue of abdomen: Secondary | ICD-10-CM | POA: Insufficient documentation

## 2016-11-11 ENCOUNTER — Ambulatory Visit (INDEPENDENT_AMBULATORY_CARE_PROVIDER_SITE_OTHER): Payer: Managed Care, Other (non HMO) | Admitting: Family Medicine

## 2016-11-11 ENCOUNTER — Encounter: Payer: Self-pay | Admitting: Family Medicine

## 2016-11-11 VITALS — BP 118/83 | HR 90 | Temp 97.9°F | Resp 17 | Ht 64.0 in | Wt 221.1 lb

## 2016-11-11 DIAGNOSIS — J0110 Acute frontal sinusitis, unspecified: Secondary | ICD-10-CM | POA: Diagnosis not present

## 2016-11-11 MED ORDER — AMOXICILLIN 875 MG PO TABS: 875.0000 mg | ORAL_TABLET | Freq: Two times a day (BID) | ORAL | 0 refills | Status: DC

## 2016-11-11 NOTE — Progress Notes (Signed)
Pre visit review using our clinic review tool, if applicable. No additional management support is needed unless otherwise documented below in the visit note. 

## 2016-11-11 NOTE — Progress Notes (Signed)
   Subjective:    Patient ID: Jody Walker, female    DOB: 14-Jul-1961, 55 y.o.   MRN: [id number]  HPI URI- sxs started 6 days ago.  + sick contacts.  Pt reports sxs are 'progressively worsening'.  + sore throat.  Some relief w/ ibuprofen.  + low grade temps.  Cough due to PND.  + frontal sinus pain.  Taking Claritin D w/o improvement.  + HA.  + tooth pain.  Pt is supposed to have surgery 1 week from today to remove GIST.   Review of Systems For ROS see HPI     Objective:   Physical Exam  Constitutional: She appears well-developed and well-nourished. No distress.  HENT:  Head: Normocephalic and atraumatic.  Right Ear: Tympanic membrane normal.  Left Ear: Tympanic membrane normal.  Nose: Mucosal edema and rhinorrhea present. Right sinus exhibits frontal sinus tenderness. Right sinus exhibits no maxillary sinus tenderness. Left sinus exhibits frontal sinus tenderness. Left sinus exhibits no maxillary sinus tenderness.  Mouth/Throat: Uvula is midline and mucous membranes are normal. Posterior oropharyngeal erythema present. No oropharyngeal exudate.  Eyes: Conjunctivae and EOM are normal. Pupils are equal, round, and reactive to light.  Neck: Normal range of motion. Neck supple.  Cardiovascular: Normal rate, regular rhythm and normal heart sounds.   Pulmonary/Chest: Effort normal and breath sounds normal. No respiratory distress. She has no wheezes.  Lymphadenopathy:    She has no cervical adenopathy.  Vitals reviewed.         Assessment & Plan:  Sinus infxn- new.  sxs and PE consistent w/ infxn.  Start abx.  Reviewed supportive care and red flags that should prompt return.  Pt expressed understanding and is in agreement w/ plan.

## 2016-11-11 NOTE — Patient Instructions (Signed)
Follow up as needed/scheduled Start the Amoxicillin twice daily- take w/ food Drink plenty of fluids Alternate tylenol/ibuprofen as needed for pain/fever REST! Call with any questions or concerns Hang in there! Jody Walker w/ surgery!!!

## 2016-11-15 DIAGNOSIS — E669 Obesity, unspecified: Secondary | ICD-10-CM | POA: Insufficient documentation

## 2016-11-18 HISTORY — PX: REMOVAL OF GASTROINTESTINAL STOMATIC  TUMOR OF STOMACH: SHX6339

## 2016-12-13 ENCOUNTER — Encounter: Payer: Self-pay | Admitting: Physician Assistant

## 2016-12-13 ENCOUNTER — Encounter: Payer: Self-pay | Admitting: Emergency Medicine

## 2016-12-13 ENCOUNTER — Ambulatory Visit (INDEPENDENT_AMBULATORY_CARE_PROVIDER_SITE_OTHER): Payer: Managed Care, Other (non HMO) | Admitting: Physician Assistant

## 2016-12-13 VITALS — BP 124/84 | HR 75 | Temp 98.1°F | Resp 16 | Ht 64.0 in | Wt 221.0 lb

## 2016-12-13 DIAGNOSIS — R52 Pain, unspecified: Secondary | ICD-10-CM | POA: Insufficient documentation

## 2016-12-13 DIAGNOSIS — C49A0 Gastrointestinal stromal tumor, unspecified site: Secondary | ICD-10-CM

## 2016-12-13 DIAGNOSIS — Z0000 Encounter for general adult medical examination without abnormal findings: Secondary | ICD-10-CM

## 2016-12-13 LAB — CBC
HCT: 38.2 % (ref 36.0–46.0)
Hemoglobin: 12.9 g/dL (ref 12.0–15.0)
MCHC: 33.7 g/dL (ref 30.0–36.0)
MCV: 89.5 fl (ref 78.0–100.0)
Platelets: 260 10*3/uL (ref 150.0–400.0)
RBC: 4.27 Mil/uL (ref 3.87–5.11)
RDW: 13.2 % (ref 11.5–15.5)
WBC: 6.3 10*3/uL (ref 4.0–10.5)

## 2016-12-13 LAB — POCT URINALYSIS DIPSTICK
Bilirubin, UA: NEGATIVE
Blood, UA: NEGATIVE
Glucose, UA: NEGATIVE
Ketones, UA: NEGATIVE
Leukocytes, UA: NEGATIVE
Nitrite, UA: NEGATIVE
Protein, UA: NEGATIVE
Spec Grav, UA: <=1.005
Urobilinogen, UA: 0.2
pH, UA: 5

## 2016-12-13 LAB — COMPREHENSIVE METABOLIC PANEL
ALT: 32 U/L (ref 0–35)
AST: 21 U/L (ref 0–37)
Albumin: 4.1 g/dL (ref 3.5–5.2)
Alkaline Phosphatase: 62 U/L (ref 39–117)
BUN: 13 mg/dL (ref 6–23)
CO2: 29 mEq/L (ref 19–32)
Calcium: 9.2 mg/dL (ref 8.4–10.5)
Chloride: 106 mEq/L (ref 96–112)
Creatinine, Ser: 0.53 mg/dL (ref 0.40–1.20)
GFR: 126.57 mL/min (ref >60.00)
Glucose, Bld: 107 mg/dL — ABNORMAL HIGH (ref 70–99)
Potassium: 4.3 mEq/L (ref 3.5–5.1)
Sodium: 140 mEq/L (ref 135–145)
Total Bilirubin: 0.5 mg/dL (ref 0.2–1.2)
Total Protein: 6.5 g/dL (ref 6.0–8.3)

## 2016-12-13 LAB — LIPID PANEL
Cholesterol: 194 mg/dL (ref 0–200)
HDL: 44.7 mg/dL (ref >39.00)
LDL Cholesterol: 119 mg/dL — ABNORMAL HIGH (ref 0–99)
NonHDL: 149.26
Total CHOL/HDL Ratio: 4
Triglycerides: 149 mg/dL (ref 0.0–149.0)
VLDL: 29.8 mg/dL (ref 0.0–40.0)

## 2016-12-13 LAB — HEMOGLOBIN A1C: Hgb A1c MFr Bld: 5.8 % (ref 4.6–6.5)

## 2016-12-13 LAB — TSH: TSH: 0.53 u[IU]/mL (ref 0.35–4.50)

## 2016-12-13 MED ORDER — MELOXICAM 15 MG PO TABS: 15.0000 mg | ORAL_TABLET | Freq: Every day | ORAL | 0 refills | Status: DC

## 2016-12-13 NOTE — Assessment & Plan Note (Signed)
Depression screen negative. Health Maintenance reviewed -- up-to-date. Followed by GI and GYN. Preventive schedule discussed and handout given in AVS. Will obtain fasting labs today.

## 2016-12-13 NOTE — Patient Instructions (Signed)
Please go to the lab for blood work.   Our office will call you with your results unless you have chosen to receive results via MyChart.  If your blood work is normal we will follow-up each year for physicals and as scheduled for chronic medical problems.  If anything is abnormal we will treat accordingly and get you in for a follow-up.  Continue chronic medications as directed.  For muscle strain, please take the Meloxicam as directed over the next week. Use tylenol if needed for breakthrough pain. Please apply topical Icy Hot to the side and hip.  Can also apply heating pad to the area.  If symptoms worsen or new symptoms develop, please come see [place] in office.   Marland Kitchen

## 2016-12-13 NOTE — Assessment & Plan Note (Signed)
Muscular tenderness reproduced with ROM.  Rx Meloxicam. Labs today. Supportive measures and OTC medications reviewed. FU if not improving.

## 2016-12-13 NOTE — Assessment & Plan Note (Signed)
Removed via surgeon. Doing well. Has FU scheduled.

## 2016-12-13 NOTE — Progress Notes (Signed)
Patient presents to clinic today for annual exam.  Patient is fasting for labs. Body mass index is 37.93 kg/m. Is swimming a few times per week. Is also starting some dancing classes. Eats an overall well-balanced diet.   Acute Concerns: Patient endorses pain of lower R side and hip over the past week. Denies known trauma or injury. Pain is aching in nature about 4/10 and worse with certain movements. Denies radiation of pain. Denies abdominal pain, nausea, vomiting, flank pain. Denies change to diet, bowel habits or bladder habits. Denies fever, chills, malaise or fatigue.   Chronic Issues:  Allergies -- Endorses well-controlled with Claritin-D as needed.  Gastrointestinal Stromal Tumor -- Recently removed. Patient tolerated well. Has FU scheduled with surgeon on 01/03/2017  Health Maintenance: Immunizations -- up-to-date. Colonoscopy -- up-to-date Mammogram -- up-to-date per patient. Up-to-da PAP -- up-to-date per patient.   Past Medical History:  Diagnosis Date  . Allergy   . Anxiety   . DDD (degenerative disc disease), lumbar   . Depression   . DJD (degenerative joint disease)    Left foot  . Fibromyalgia   . GERD (gastroesophageal reflux disease)   . GIST, non-malignant   . Herniated disc   . History of nephrolithiasis   . Hyperlipidemia   . IBS (irritable bowel syndrome)   . RLS (restless legs syndrome)   . Sciatica    recurrent left    Past Surgical History:  Procedure Laterality Date  . ABDOMINAL HYSTERECTOMY    . CESAREAN SECTION    . LEFT OOPHORECTOMY    . MASTECTOMY Bilateral    Double  . 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 s/p basket extraction renal stone  1990's  . s/p skin cancer- "pre-melanoma"- right ear    . TONSILLECTOMY    . TUBAL LIGATION      Current Outpatient Prescriptions on File Prior to Visit  Medication Sig Dispense Refill  . diazepam (VALIUM) 2 MG tablet Take 1 tablet (2 mg total) by mouth every  12 (twelve) hours as needed for anxiety. 15 tablet 0  . famotidine (PEPCID) 10 MG tablet Take 10 mg by mouth.    . fluticasone (FLONASE) 50 MCG/ACT nasal spray Place 2 sprays into the nose daily.    Marland Kitchen loratadine-pseudoephedrine (CLARITIN-D 12 HOUR) 5-120 MG tablet Take 1 tablet by mouth 2 (two) times daily. 30 tablet 0  . naproxen sodium (ANAPROX) 220 MG tablet Take 1 tablet by mouth daily.     No current facility-administered medications on file prior to visit.     Allergies  Allergen Reactions  . Hydrocodone-Acetaminophen     REACTION: heart palpations  . Levofloxacin Other (See Comments)    She had an outer body experience  . Other     White Steri Strips-Rash  . Keflex [Cephalexin] Rash    Family History  Problem Relation Age of Onset  . Hypertension Mother   . Stroke Mother   . Heart disease Father   . Hyperlipidemia Father   . Asthma Other     father, grandmother  . Cancer Other     skin, breast, colon, uterine, renal    Social History   Social History  . Marital status: Married    Spouse name: N/A  . Number of children: N/A  . Years of education: N/A   Occupational History  . Not on file.   Social History Main Topics  . Smoking status: Never Smoker  .  Smokeless tobacco: Never Used  . Alcohol use No  . Drug use: No  . Sexual activity: Not Currently   Other Topics Concern  . 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 All healthy    Review of Systems  Constitutional: Negative for fever and weight loss.  HENT: Negative for ear discharge, ear pain, hearing loss and tinnitus.   Eyes: Negative for blurred vision, double vision, photophobia and pain.  Respiratory: Negative for cough and shortness of breath.   Cardiovascular: Negative for chest pain and palpitations.  Gastrointestinal: Negative for abdominal pain, blood in stool, constipation, diarrhea, heartburn, melena, nausea  and vomiting.  Genitourinary: Negative for dysuria, flank pain, frequency, hematuria and urgency.  Musculoskeletal: Positive for myalgias. Negative for falls.  Neurological: Negative for dizziness, loss of consciousness and headaches.  Endo/Heme/Allergies: Negative for environmental allergies.  Psychiatric/Behavioral: Negative for depression, hallucinations, substance abuse and suicidal ideas. The patient is not nervous/anxious and does not have insomnia.    BP 124/84   Pulse 75   Temp 98.1 F (36.7 C) (Oral)   Resp 16   Ht 5\' 4"  (1.626 m)   Wt 221 lb (100.2 kg)   SpO2 98%   BMI 37.93 kg/m   Physical Exam  Constitutional: She is oriented to person, place, and time and well-developed, well-nourished, and in no distress.  HENT:  Head: Normocephalic and atraumatic.  Right Ear: Tympanic membrane, external ear and ear canal normal.  Left Ear: Tympanic membrane, external ear and ear canal normal.  Nose: Nose normal. No mucosal edema.  Mouth/Throat: Uvula is midline, oropharynx is clear and moist and mucous membranes are normal. No oropharyngeal exudate or posterior oropharyngeal erythema.  Eyes: Conjunctivae are normal. Pupils are equal, round, and reactive to light.  Neck: Neck supple. No thyromegaly present.  Cardiovascular: Normal rate, regular rhythm, normal heart sounds and intact distal pulses.   Pulmonary/Chest: Effort normal and breath sounds normal. No respiratory distress. She has no wheezes. She has no rales.  Abdominal: Soft. Bowel sounds are normal. She exhibits no distension and no mass. There is no tenderness. There is no rebound and no guarding.  Musculoskeletal:       Right hip: She exhibits tenderness. She exhibits normal range of motion, normal strength and no bony tenderness.  Tenderness with flexion of Hip and abduction. No pain with rotation.   Lymphadenopathy:    She has no cervical adenopathy.  Neurological: She is alert and oriented to person, place, and time.  No cranial nerve deficit.  Skin: Skin is warm and dry. No rash noted.  Psychiatric: Affect normal.  Vitals reviewed.  Recent Results (from the past 2160 hour(s))  POCT urinalysis dipstick     Status: Normal   Collection Time: 12/13/16  9:55 AM  Result Value Ref Range   Color, UA staw    Clarity, UA clear    Glucose, UA negative    Bilirubin, UA negative    Ketones, UA negative    Spec Grav, UA <=1.005    Blood, UA negative    pH, UA 5.0    Protein, UA negative    Urobilinogen, UA 0.2    Nitrite, UA negative    Leukocytes, UA Negative Negative    Assessment/Plan: Visit for preventive health examination Depression screen negative. Health Maintenance reviewed -- up-to-date. Followed by GI and GYN. Preventive schedule discussed and handout given in  AVS. Will obtain fasting labs today.   Pain of right side of body Muscular tenderness reproduced with ROM.  Rx Meloxicam. Labs today. Supportive measures and OTC medications reviewed. FU if not improving.   Gastrointestinal stromal tumor (GIST) ([place]) Removed via surgeon. Doing well. Has FU scheduled.     Leeanne Rio, PA-C

## 2016-12-13 NOTE — Progress Notes (Signed)
Pre visit review using our clinic review tool, if applicable. No additional management support is needed unless otherwise documented below in the visit note. 

## 2017-03-23 ENCOUNTER — Encounter: Payer: Self-pay | Admitting: Physician Assistant

## 2017-03-23 ENCOUNTER — Ambulatory Visit (INDEPENDENT_AMBULATORY_CARE_PROVIDER_SITE_OTHER): Payer: Managed Care, Other (non HMO) | Admitting: Physician Assistant

## 2017-03-23 VITALS — BP 124/84 | HR 80 | Temp 98.1°F | Resp 14 | Ht 60.0 in | Wt 224.0 lb

## 2017-03-23 DIAGNOSIS — L237 Allergic contact dermatitis due to plants, except food: Secondary | ICD-10-CM

## 2017-03-23 DIAGNOSIS — R5383 Other fatigue: Secondary | ICD-10-CM | POA: Diagnosis not present

## 2017-03-23 DIAGNOSIS — L255 Unspecified contact dermatitis due to plants, except food: Secondary | ICD-10-CM

## 2017-03-23 LAB — VITAMIN B12: Vitamin B-12: 270 pg/mL (ref 211–911)

## 2017-03-23 MED ORDER — PREDNISONE 10 MG PO TABS: ORAL_TABLET | ORAL | 0 refills | Status: DC

## 2017-03-23 MED ORDER — METHYLPREDNISOLONE ACETATE 80 MG/ML IJ SUSP
80.0000 mg | Freq: Once | INTRAMUSCULAR | Status: AC
  Administered 2017-03-23: 80 mg via INTRAMUSCULAR

## 2017-03-23 NOTE — Addendum Note (Signed)
Addended by: Leonidas Romberg on: 03/23/2017 01:50 PM   Modules accepted: Orders

## 2017-03-23 NOTE — Progress Notes (Signed)
Patient presents to clinic today c/o 3 days of itching rash of bilateral upper and lower extremities, chest and face. Notes working in her garden and being exposed to poison ivy which she has an allergy to. Denies vision changes, fever, mouth swelling, shortness of breath. Has been applying a topical benadryl with only some relief of symptoms.   Past Medical History:  Diagnosis Date  . Allergy   . Anxiety   . DDD (degenerative disc disease), lumbar   . Depression   . DJD (degenerative joint disease)    Left foot  . Fibromyalgia   . GERD (gastroesophageal reflux disease)   . GIST, non-malignant   . Herniated disc   . History of nephrolithiasis   . Hyperlipidemia   . IBS (irritable bowel syndrome)   . RLS (restless legs syndrome)   . Sciatica    recurrent left    Current Outpatient Prescriptions on File Prior to Visit  Medication Sig Dispense Refill  . diazepam (VALIUM) 2 MG tablet Take 1 tablet (2 mg total) by mouth every 12 (twelve) hours as needed for anxiety. 15 tablet 0  . famotidine (PEPCID) 10 MG tablet Take 10 mg by mouth.    . fluticasone (FLONASE) 50 MCG/ACT nasal spray Place 2 sprays into the nose daily.    Marland Kitchen loratadine-pseudoephedrine (CLARITIN-D 12 HOUR) 5-120 MG tablet Take 1 tablet by mouth 2 (two) times daily. 30 tablet 0  . meloxicam (MOBIC) 15 MG tablet Take 1 tablet (15 mg total) by mouth daily. 20 tablet 0  . naproxen sodium (ANAPROX) 220 MG tablet Take 1 tablet by mouth daily.     No current facility-administered medications on file prior to visit.     Allergies  Allergen Reactions  . Hydrocodone-Acetaminophen     REACTION: heart palpations  . Levofloxacin Other (See Comments)    She had an outer body experience  . Other     White Steri Strips-Rash  . Keflex [Cephalexin] Rash    Family History  Problem Relation Age of Onset  . Hypertension Mother   . Stroke Mother   . Heart disease Father   . Hyperlipidemia Father   . Asthma Other     father,  grandmother  . Cancer Other     skin, breast, colon, uterine, renal    Social History   Social History  . Marital status: Married    Spouse name: N/A  . Number of children: N/A  . Years of education: N/A   Social History Main Topics  . Smoking status: Never Smoker  . Smokeless tobacco: Never Used  . Alcohol use No  . Drug use: No  . Sexual activity: Not Currently   Other Topics Concern  . 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 All healthy   Review of Systems - See HPI.  All other ROS are negative.  BP 124/84   Pulse 80   Temp 98.1 F (36.7 C) (Oral)   Resp 14   Ht 5' (1.524 m)   Wt 224 lb (101.6 kg)   SpO2 99%   BMI 43.75 kg/m   Physical Exam  Constitutional: She is oriented to person, place, and time and well-developed, well-nourished, and in no distress.  HENT:  Head: Normocephalic and atraumatic.  Eyes: Conjunctivae and EOM are normal. Pupils are equal, round, and reactive to light.  Neck: Neck supple.  Cardiovascular: Normal rate,  regular rhythm, normal heart sounds and intact distal pulses.   Pulmonary/Chest: Effort normal and breath sounds normal. No respiratory distress. She has no wheezes. She has no rales. She exhibits no tenderness.  Neurological: She is alert and oriented to person, place, and time.  Skin: Skin is warm and dry. No rash noted.  Maculopapular erythematous rash scattered -- bilateral upper and lower extremities, chest and left face and L periorbital region.   Psychiatric: Affect normal.  Vitals reviewed.  Assessment/Plan: 1. Rhus dermatitis 80 IM depomedrol given. Rx pred taper to start tomorrow. Supportive measures and OTC medications reviewed. Alarm signs/symptoms discussed with patient that would warrant urgent ER assessment.   - predniSONE (DELTASONE) 10 MG tablet; Take 3 tablets by mouth x 2 days, then 2 tablets by mouth x 2 days, then 1 tablet x 2 days.   Dispense: 12 tablet; Refill: 0   Leeanne Rio, [place]

## 2017-03-23 NOTE — Patient Instructions (Signed)
The shot given today should kick start resolution. Start the oral prednisone taper tomorrow as directed. Keep skin clean and dry. Cold compresses may be beneficial for itch. Avoid scratching at skin.   Poison Ivy Dermatitis Poison ivy dermatitis is redness and soreness (inflammation) of the skin. It is caused by a chemical that is found on the leaves of the poison ivy plant. You may also have itching, a rash, and blisters. Symptoms often clear up in 1-2 weeks. You may get this condition by touching a poison ivy plant. You can also get it by touching something that has the chemical on it. This may include animals or objects that have come in contact with the plant. Follow these instructions at home: General instructions   Take or apply over-the-counter and prescription medicines only as told by your doctor.  If you touch poison ivy, wash your skin with soap and cold water right away.  Use hydrocortisone creams or calamine lotion as needed to help with itching.  Take oatmeal baths as needed. Use colloidal oatmeal. You can get this at a pharmacy or grocery store. Follow the instructions on the package.  Do not scratch or rub your skin.  While you have the rash, wash your clothes right after you wear them. Prevention   Know what poison ivy looks like so you can avoid it. This plant has three leaves with flowering branches on a single stem. The leaves are glossy. They have uneven edges that come to a point at the front.  If you have touched poison ivy, wash with soap and water right away. Be sure to wash under your fingernails.  When hiking or camping, wear long pants, a long-sleeved shirt, tall socks, and hiking boots. You can also use a lotion on your skin that helps to prevent contact with the chemical on the plant.  If you think that your clothes or outdoor gear came in contact with poison ivy, rinse them off with a garden hose before you bring them inside your house. Contact a doctor  if:  You have open sores in the rash area.  You have more redness, swelling, or pain in the affected area.  You have redness that spreads beyond the rash area.  You have fluid, blood, or pus coming from the affected area.  You have a fever.  You have a rash over a large area of your body.  You have a rash on your eyes, mouth, or genitals.  Your rash does not get better after a few days. Get help right away if:  Your face swells or your eyes swell shut.  You have trouble breathing.  You have trouble swallowing. This information is not intended to replace advice given to you by your health care provider. Make sure you discuss any questions you have with your health care provider. Document Released: 12/18/2010 Document Revised: 04/22/2016 Document Reviewed: 04/23/2015 Elsevier Interactive Patient Education  2017 [employer].

## 2017-03-23 NOTE — Progress Notes (Signed)
Pre visit review using our clinic review tool, if applicable. No additional management support is needed unless otherwise documented below in the visit note. 

## 2017-03-27 LAB — VITAMIN D 1,25 DIHYDROXY
Vitamin D 1, 25 (OH)2 Total: 46 pg/mL (ref 18–72)
Vitamin D2 1, 25 (OH)2: <8 pg/mL
Vitamin D3 1, 25 (OH)2: 46 pg/mL

## 2017-03-31 ENCOUNTER — Encounter: Payer: Self-pay | Admitting: Physician Assistant

## 2017-03-31 ENCOUNTER — Ambulatory Visit (INDEPENDENT_AMBULATORY_CARE_PROVIDER_SITE_OTHER): Payer: Managed Care, Other (non HMO) | Admitting: Physician Assistant

## 2017-03-31 VITALS — BP 120/80 | HR 82 | Temp 98.4°F | Resp 14 | Ht 60.0 in | Wt 218.0 lb

## 2017-03-31 DIAGNOSIS — M797 Fibromyalgia: Secondary | ICD-10-CM

## 2017-03-31 DIAGNOSIS — J0100 Acute maxillary sinusitis, unspecified: Secondary | ICD-10-CM

## 2017-03-31 MED ORDER — AMOXICILLIN-POT CLAVULANATE 875-125 MG PO TABS: 1.0000 | ORAL_TABLET | Freq: Two times a day (BID) | ORAL | 0 refills | Status: AC

## 2017-03-31 MED ORDER — AMITRIPTYLINE HCL 10 MG PO TABS: 10.0000 mg | ORAL_TABLET | Freq: Every day | ORAL | 1 refills | Status: DC

## 2017-03-31 NOTE — Patient Instructions (Signed)
Please take antibiotic as directed.  Increase fluid intake.  Use Saline nasal spray.  Take a daily multivitamin. Continue Flonase and Claritin-D.  Place a humidifier in the bedroom.  Please call or return clinic if symptoms are not improving.  Start the Amitriptyline in the evening to start helping with rest and pain levels. We will likely need to titrate this further. Follow-up with me in 3 weeks.   Sinusitis Sinusitis is redness, soreness, and swelling (inflammation) of the paranasal sinuses. Paranasal sinuses are air pockets within the bones of your face (beneath the eyes, the middle of the forehead, or above the eyes). In healthy paranasal sinuses, mucus is able to drain out, and air is able to circulate through them by way of your nose. However, when your paranasal sinuses are inflamed, mucus and air can become trapped. This can allow bacteria and other germs to grow and cause infection. Sinusitis can develop quickly and last only a short time (acute) or continue over a long period (chronic). Sinusitis that lasts for more than 12 weeks is considered chronic.  CAUSES  Causes of sinusitis include:  Allergies.  Structural abnormalities, such as displacement of the cartilage that separates your nostrils (deviated septum), which can decrease the air flow through your nose and sinuses and affect sinus drainage.  Functional abnormalities, such as when the small hairs (cilia) that line your sinuses and help remove mucus do not work properly or are not present. SYMPTOMS  Symptoms of acute and chronic sinusitis are the same. The primary symptoms are pain and pressure around the affected sinuses. Other symptoms include:  Upper toothache.  Earache.  Headache.  Bad breath.  Decreased sense of smell and taste.  A cough, which worsens when you are lying flat.  Fatigue.  Fever.  Thick drainage from your nose, which often is green and may contain pus (purulent).  Swelling and warmth over the  affected sinuses. DIAGNOSIS  Your caregiver will perform a physical exam. During the exam, your caregiver may:  Look in your nose for signs of abnormal growths in your nostrils (nasal polyps).  Tap over the affected sinus to check for signs of infection.  View the inside of your sinuses (endoscopy) with a special imaging device with a light attached (endoscope), which is inserted into your sinuses. If your caregiver suspects that you have chronic sinusitis, one or more of the following tests may be recommended:  Allergy tests.  Nasal culture A sample of mucus is taken from your nose and sent to a lab and screened for bacteria.  Nasal cytology A sample of mucus is taken from your nose and examined by your caregiver to determine if your sinusitis is related to an allergy. TREATMENT  Most cases of acute sinusitis are related to a viral infection and will resolve on their own within 10 days. Sometimes medicines are prescribed to help relieve symptoms (pain medicine, decongestants, nasal steroid sprays, or saline sprays).  However, for sinusitis related to a bacterial infection, your caregiver will prescribe antibiotic medicines. These are medicines that will help kill the bacteria causing the infection.  Rarely, sinusitis is caused by a fungal infection. In theses cases, your caregiver will prescribe antifungal medicine. For some cases of chronic sinusitis, surgery is needed. Generally, these are cases in which sinusitis recurs more than 3 times per year, despite other treatments. HOME CARE INSTRUCTIONS   Drink plenty of water. Water helps thin the mucus so your sinuses can drain more easily.  Use a humidifier.  Inhale steam 3 to 4 times a day (for example, sit in the bathroom with the shower running).  Apply a warm, moist washcloth to your face 3 to 4 times a day, or as directed by your caregiver.  Use saline nasal sprays to help moisten and clean your sinuses.  Take over-the-counter or  prescription medicines for pain, discomfort, or fever only as directed by your caregiver. SEEK IMMEDIATE MEDICAL CARE IF:  You have increasing pain or severe headaches.  You have nausea, vomiting, or drowsiness.  You have swelling around your face.  You have vision problems.  You have a stiff neck.  You have difficulty breathing. MAKE SURE YOU:   Understand these instructions.  Will watch your condition.  Will get help right away if you are not doing well or get worse. Document Released: 11/15/2005 Document Revised: 02/07/2012 Document Reviewed: 11/30/2011 [facility] Patient Information 2014 [place], [place].

## 2017-03-31 NOTE — Progress Notes (Signed)
Subjective:     Jody Walker is a 56 y.o. female who presents for evaluation of symptoms of a URI, possible sinusitis. Symptoms include bilateral ear pressure/pain, congestion, facial pain, nasal congestion, non productive cough and sinus pressure. Onset of symptoms was 6 days ago, and has been gradually worsening since that time. Denies fever. Treatment to date: antihistamines, decongestants and nasal steroids.  The following portions of the patient's history were reviewed and updated as appropriate: allergies, current medications, past family history, past medical history, past social history, past surgical history and problem list.  Review of Systems Pertinent items are noted in HPI.   Objective:    BP 120/80   Pulse 82   Temp 98.4 F (36.9 C) (Oral)   Resp 14   Ht 5' (1.524 m)   Wt 218 lb (98.9 kg)   SpO2 97%   BMI 42.58 kg/m  General appearance: alert, cooperative, appears stated age and no distress Head: Normocephalic, without obvious abnormality, atraumatic Ears: abnormal TM right ear - serous middle ear fluid and abnormal TM left ear - serous middle ear fluid Nose: Nares normal. Septum midline. Mucosa normal. No drainage or sinus tenderness., turbinates red, swollen, sinus tenderness bilateral Throat: lips, mucosa, and tongue normal; teeth and gums normal Lungs: clear to auscultation bilaterally Heart: regular rate and rhythm, S1, S2 normal, no murmur, click, rub or gallop Skin: Skin color, texture, turgor normal. No rashes or lesions   Assessment:   1. Acute non-recurrent maxillary sinusitis - amoxicillin-clavulanate (AUGMENTIN) 875-125 MG tablet; Take 1 tablet by mouth 2 (two) times daily.  Dispense: 14 tablet; Refill: 0   Plan:    Discussed the diagnosis and treatment of sinusitis. Suggested symptomatic OTC remedies. Nasal saline spray for congestion. Augmentin per orders. Nasal steroids per orders. Follow up as needed. 1

## 2017-03-31 NOTE — Progress Notes (Signed)
Pre visit review using our clinic review tool, if applicable. No additional management support is needed unless otherwise documented below in the visit note. 

## 2017-04-21 ENCOUNTER — Ambulatory Visit: Payer: Managed Care, Other (non HMO) | Admitting: Physician Assistant

## 2017-05-12 ENCOUNTER — Encounter: Payer: Self-pay | Admitting: Physician Assistant

## 2017-05-12 ENCOUNTER — Ambulatory Visit (INDEPENDENT_AMBULATORY_CARE_PROVIDER_SITE_OTHER): Payer: Managed Care, Other (non HMO) | Admitting: Physician Assistant

## 2017-05-12 DIAGNOSIS — M797 Fibromyalgia: Secondary | ICD-10-CM

## 2017-05-12 MED ORDER — TRAMADOL HCL 50 MG PO TABS: 50.0000 mg | ORAL_TABLET | Freq: Two times a day (BID) | ORAL | 0 refills | Status: DC | PRN

## 2017-05-12 NOTE — Patient Instructions (Signed)
Please take medications as directed. Follow-up with your specialist regarding the injections. Let me know how this is going.  Follow-up with me in 1 month regarding the pain medication.  Make sure to send me a copy of your recent bill to MyChart so we can review!

## 2017-05-12 NOTE — Progress Notes (Signed)
Pre visit review using our clinic review tool, if applicable. No additional management support is needed unless otherwise documented below in the visit note. 

## 2017-05-12 NOTE — Assessment & Plan Note (Signed)
Continue follow-up with specialty for injections. Will start trial of Tramadol on rare basis for bad days. Will see how this helps. Follow-up scheduled. If needing on a more regular basis, will need pain management. Will hold off on Elavil at present.

## 2017-05-12 NOTE — Progress Notes (Signed)
Patient presents to clinic today for follow-up of chronic arthritic pain and fibromyalgia. At last visit Elavil was started. Patient states she has not taken as directed. States new medications make her very leery giving prior medication side effects. Has set up with [facility] and is starting with injections that have given some improvement. States she is scheduled for her next injection in 2 weeks. Would like to discuss starting Tramadol on prn basis for severe pain. Has taken before and tolerated well.   Past Medical History:  Diagnosis Date  . Allergy   . Anxiety   . DDD (degenerative disc disease), lumbar   . Depression   . DJD (degenerative joint disease)    Left foot  . Fibromyalgia   . GERD (gastroesophageal reflux disease)   . GIST, non-malignant   . Herniated disc   . History of nephrolithiasis   . Hyperlipidemia   . IBS (irritable bowel syndrome)   . RLS (restless legs syndrome)   . Sciatica    recurrent left    Current Outpatient Prescriptions on File Prior to Visit  Medication Sig Dispense Refill  . diazepam (VALIUM) 2 MG tablet Take 1 tablet (2 mg total) by mouth every 12 (twelve) hours as needed for anxiety. 15 tablet 0  . famotidine (PEPCID) 10 MG tablet Take 10 mg by mouth.    . fluticasone (FLONASE) 50 MCG/ACT nasal spray Place 2 sprays into the nose daily.    Marland Kitchen loratadine-pseudoephedrine (CLARITIN-D 12 HOUR) 5-120 MG tablet Take 1 tablet by mouth 2 (two) times daily. 30 tablet 0   No current facility-administered medications on file prior to visit.     Allergies  Allergen Reactions  . Hydrocodone-Acetaminophen     REACTION: heart palpations  . Levofloxacin Other (See Comments)    She had an outer body experience  . Other     White Steri Strips-Rash  . Keflex [Cephalexin] Rash    Family History  Problem Relation Age of Onset  . Hypertension Mother   . Stroke Mother   . Heart disease Father   . Hyperlipidemia Father   .  Asthma Other        father, grandmother  . Cancer Other        skin, breast, colon, uterine, renal    Social History   Social History  . Marital status: Married    Spouse name: N/A  . Number of children: N/A  . Years of education: N/A   Social History Main Topics  . Smoking status: Never Smoker  . Smokeless tobacco: Never Used  . Alcohol use No  . Drug use: No  . Sexual activity: Not Currently   Other Topics Concern  . 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 All healthy    Review of Systems - See HPI.  All other ROS are negative.  BP 126/84   Pulse 90   Temp 97.7 F (36.5 C)   Resp 14   Ht 5' (1.524 m)   Wt 215 lb (97.5 kg)   SpO2 97%   BMI 41.99 kg/m   Physical Exam  Constitutional: She is oriented to person, place, and time and well-developed, well-nourished, and in no distress.  HENT:  Head: Normocephalic and atraumatic.  Eyes: Conjunctivae are normal.  Neck: Neck supple.  Cardiovascular: Normal rate, regular rhythm, normal heart sounds and intact distal pulses.  Pulmonary/Chest: Effort normal and breath sounds normal. No respiratory distress. She has no wheezes. She has no rales. She exhibits no tenderness.  Neurological: She is alert and oriented to person, place, and time.  Skin: Skin is warm and dry. No rash noted.  Psychiatric: Affect normal.  Vitals reviewed.   Recent Results (from the past 2160 hour(s))  B12     Status: None   Collection Time: 03/23/17  1:38 PM  Result Value Ref Range   Vitamin B-12 270 211 - 911 pg/mL  Vitamin D 1,25 dihydroxy     Status: None   Collection Time: 03/23/17  1:38 PM  Result Value Ref Range   Vitamin D 1, 25 (OH)2 Total 46 18 - 72 pg/mL   Vitamin D3 1, 25 (OH)2 46 pg/mL   Vitamin D2 1, 25 (OH)2 <8 pg/mL    Comment: Vitamin D3, 1,25(OH)2 indicates both endogenous production and supplementation.  Vitamin D2, 1,25(OH)2 is an indicator of  exogeous sources, such as diet or supplementation.  Interpretation and therapy are based on measurement of Vitamin D,1,25(OH)2, Total. This test was developed and its analytical performance characteristics have been determined by [facility], [place], [place]. It has not been cleared or approved by the FDA. This assay has been validated pursuant to the CLIA regulations and is used for clinical purposes.     Assessment/Plan: Fibromyalgia Continue follow-up with specialty for injections. Will start trial of Tramadol on rare basis for bad days. Will see how this helps. Follow-up scheduled. If needing on a more regular basis, will need pain management. Will hold off on Elavil at present.     Leeanne Rio, PA-C

## 2017-05-27 ENCOUNTER — Encounter: Payer: Self-pay | Admitting: Family Medicine

## 2017-05-27 ENCOUNTER — Ambulatory Visit (INDEPENDENT_AMBULATORY_CARE_PROVIDER_SITE_OTHER): Payer: Managed Care, Other (non HMO) | Admitting: Family Medicine

## 2017-05-27 VITALS — BP 115/77 | HR 80 | Temp 98.6°F | Resp 20 | Wt 213.8 lb

## 2017-05-27 DIAGNOSIS — S20469A Insect bite (nonvenomous) of unspecified back wall of thorax, initial encounter: Secondary | ICD-10-CM | POA: Diagnosis not present

## 2017-05-27 DIAGNOSIS — W57XXXA Bitten or stung by nonvenomous insect and other nonvenomous arthropods, initial encounter: Secondary | ICD-10-CM

## 2017-05-27 MED ORDER — DOXYCYCLINE HYCLATE 100 MG PO TABS: 100.0000 mg | ORAL_TABLET | Freq: Two times a day (BID) | ORAL | 0 refills | Status: DC

## 2017-05-27 NOTE — Progress Notes (Signed)
Jody Walker , [birth year], 56 y.o., female MRN: [id number] Patient Care Team    Relationship Specialty Notifications Start End  Delorse Limber PCP - General Family Medicine  10/15/16     Chief Complaint  Patient presents with  . Insect Bite    tick happened 5 days ago tick wasnt removed until today     Subjective: Pt presents for an OV with complaints of Tick bite. Patient reports she had a redness and swelling on her back that she noticed approximately 5 days ago. Her family looked at a and thought it was just a bug bite, and they have been putting anti-itch ointment over the area. Today however another family member looked at it and noticed it was a tick attached to her. The tick was easily removed. Patient is concerned over how large the redness and irritation is around the bite. She denies fever or rash. She endorses mild malaise and headache on Monday.    Depression screen PHQ 2/9 10/15/2016  Decreased Interest 1  Down, Depressed, Hopeless 2  PHQ - 2 Score 3  Altered sleeping 1  Tired, decreased energy 1  Change in appetite 1  Trouble concentrating 0  Moving slowly or fidgety/restless 0  Suicidal thoughts 0  PHQ-9 Score 6    Allergies  Allergen Reactions  . Hydrocodone-Acetaminophen     REACTION: heart palpations  . Levofloxacin Other (See Comments)    She had an outer body experience  . Other     White Steri Strips-Rash  . Keflex [Cephalexin] Rash   Social History  Substance Use Topics  . Smoking status: Never Smoker  . Smokeless tobacco: Never Used  . Alcohol use No   Past Medical History:  Diagnosis Date  . Allergy   . Anxiety   . DDD (degenerative disc disease), lumbar   . Depression   . DJD (degenerative joint disease)    Left foot  . Fibromyalgia   . GERD (gastroesophageal reflux disease)   . GIST, non-malignant   . Herniated disc   . History of nephrolithiasis   . Hyperlipidemia   . IBS (irritable bowel syndrome)   . RLS (restless  legs syndrome)   . Sciatica    recurrent left   Past Surgical History:  Procedure Laterality Date  . ABDOMINAL HYSTERECTOMY    . CESAREAN SECTION    . LEFT OOPHORECTOMY    . MASTECTOMY Bilateral    Double  . 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 s/p basket extraction renal stone  1990's  . s/p skin cancer- "pre-melanoma"- right ear    . TONSILLECTOMY    . TUBAL LIGATION     Family History  Problem Relation Age of Onset  . Hypertension Mother   . Stroke Mother   . Heart disease Father   . Hyperlipidemia Father   . Asthma Other        father, grandmother  . Cancer Other        skin, breast, colon, uterine, renal   Allergies as of 05/27/2017      Reactions   Hydrocodone-acetaminophen    REACTION: heart palpations   Levofloxacin Other (See Comments)   She had an outer body experience   Other    White Steri Strips-Rash   Keflex [cephalexin] Rash      Medication List       Accurate as of 05/27/17  4:28 PM. Always use your most recent med list.  amitriptyline 10 MG tablet Commonly known as:  ELAVIL TAKE 1 TABLET BY MOUTH EVERYDAY AT BEDTIME   diazepam 2 MG tablet Commonly known as:  VALIUM Take 1 tablet (2 mg total) by mouth every 12 (twelve) hours as needed for anxiety.   famotidine 10 MG tablet Commonly known as:  PEPCID Take 10 mg by mouth.   fluticasone 50 MCG/ACT nasal spray Commonly known as:  FLONASE Place 2 sprays into the nose daily.   loratadine-pseudoephedrine 5-120 MG tablet Commonly known as:  CLARITIN-D 12 HOUR Take 1 tablet by mouth 2 (two) times daily.   traMADol 50 MG tablet Commonly known as:  ULTRAM Take 1 tablet (50 mg total) by mouth every 12 (twelve) hours as needed.       All past medical history, surgical history, allergies, family history, immunizations andmedications were updated in the EMR today and reviewed under the history and medication portions of their EMR.     ROS: Negative,  with the exception of above mentioned in HPI   Objective:  BP 115/77 (BP Location: Left Arm, Patient Position: Sitting, Cuff Size: Large)   Pulse 80   Temp 98.6 F (37 C)   Resp 20   Wt 213 lb 12 oz (97 kg)   SpO2 97%   BMI 41.75 kg/m  Body mass index is 41.75 kg/m. Gen: Afebrile. No acute distress. Nontoxic in appearance, well developed, well nourished.  HENT: AT. [place].MMM, no oral lesions.  Eyes:Pupils Equal Round Reactive to light, Extraocular movements intact,  Conjunctiva without redness, discharge or icterus. Skin: No rashes, purpura or petechiae. Midline, between shoulder blades ~ 5 cm oval area of redness and swelling surrounding insect bite. Bite inspected under magnification and no remnants of insect remained. Neuro: Normal gait. PERLA. EOMi. Alert. Oriented x3   No exam data present No results found. No results found for this or any previous visit (from the past 24 hour(s)).  Assessment/Plan: Jody Walker is a 56 y.o. female present for OV for  Tick bite, initial encounter - Rest, hydrate, NSAIDs as needed. Hydrocortisone cream. - Discussed Lyme exposure. Given her local reaction is quite significant, malaise and history of fever or treat with 14 days of doxycycline twice a day. Patient was given avs notification on signs and symptoms to monitor herself for need of further treatment. - doxycycline (VIBRA-TABS) 100 MG tablet; Take 1 tablet (100 mg total) by mouth 2 (two) times daily.  Dispense: 28 tablet; Refill: 0   Reviewed expectations re: course of current medical issues.  Discussed self-management of symptoms.  Outlined signs and symptoms indicating need for more acute intervention.  Patient verbalized understanding and all questions were answered.  Patient received an After-Visit Summary.   No orders of the defined types were placed in this encounter.    Note is dictated utilizing voice recognition software. Although note has been proof read prior to  signing, occasional typographical errors still can be missed. If any questions arise, please do not hesitate to call for verification.   electronically signed by:  Howard Pouch, DO  [place]

## 2017-05-27 NOTE — Patient Instructions (Signed)
Start doxy every 12 hours for 14 days. Watch for signs or symptoms of lyme we discussed.  You can use hydrocortisone cream over it.      Tick Bite Information Introduction Ticks are insects that attach themselves to the skin. There are many types of ticks. Common types include wood ticks and deer ticks. Sometimes, ticks carry diseases that can make a person very ill. The most common places for ticks to attach themselves are the scalp, neck, armpits, waist, and groin. HOW CAN YOU PREVENT TICK BITES? Take these steps to help prevent tick bites when you are outdoors:  Wear long sleeves and long pants.  Wear white clothes so you can see ticks more easily.  Tuck your pant legs into your socks.  If walking on a trail, stay in the middle of the trail to avoid brushing against bushes.  Avoid walking through areas with long grass.  Put bug spray on all skin that is showing and along boot tops, pant legs, and sleeve cuffs.  Check clothes, hair, and skin often and before going inside.  Brush off any ticks that are not attached.  Take a shower or bath as soon as possible after being outdoors.  HOW SHOULD YOU REMOVE A TICK? Ticks should be removed as soon as possible to help prevent diseases. 1. If latex gloves are available, put them on before trying to remove a tick. 2. Use tweezers to grasp the tick as close to the skin as possible. You may also use curved forceps or a tick removal tool. Grasp the tick as close to its head as possible. Avoid grasping the tick on its body. 3. Pull gently upward until the tick lets go. Do not twist the tick or jerk it suddenly. This may break off the tick's head or mouth parts. 4. Do not squeeze or crush the tick's body. This could force disease-carrying fluids from the tick into your body. 5. After the tick is removed, wash the bite area and your hands with soap and water or alcohol. 6. Apply a small amount of antiseptic cream or ointment to the bite  site. 7. Wash any tools that were used.  Do not try to remove a tick by applying a hot match, petroleum jelly, or fingernail polish to the tick. These methods do not work. They may also increase the chances of disease being spread from the tick bite. WHEN SHOULD YOU SEEK HELP? Contact your health care provider if you are unable to remove a tick or if a part of the tick breaks off in the skin. After a tick bite, you need to watch for signs and symptoms of diseases that can be spread by ticks. Contact your health care provider if you develop any of the following:  Fever.  Rash.  Redness and puffiness (swelling) in the area of the tick bite.  Tender, puffy lymph glands.  Watery poop (diarrhea).  Weight loss.  Cough.  Feeling more tired than normal (fatigue).  Muscle, joint, or bone pain.  Belly (abdominal) pain.  Headache.  Change in your level of consciousness.  Trouble walking or moving your legs.  Loss of feeling (numbness) in the legs.  Loss of movement (paralysis).  Shortness of breath.  Confusion.  Throwing up (vomiting) many times.  This information is not intended to replace advice given to you by your health care provider. Make sure you discuss any questions you have with your health care provider. Document Released: 02/09/2010 Document Revised: 04/22/2016 Document Reviewed:  04/25/2013 Elsevier Interactive Patient Education  [employer].

## 2017-06-15 ENCOUNTER — Other Ambulatory Visit: Payer: Self-pay | Admitting: Physician Assistant

## 2017-06-15 NOTE — Telephone Encounter (Signed)
Last rx was 10/15/16 #15 Please advise

## 2017-06-16 NOTE — Telephone Encounter (Signed)
Rx faxed to pharmacy  

## 2017-07-10 ENCOUNTER — Other Ambulatory Visit: Payer: Self-pay | Admitting: Physician Assistant

## 2017-07-11 NOTE — Telephone Encounter (Signed)
Tramadol last rx 05/12/17 #30 CSC: none UDS: none  Last OV: 05/12/17 Fibromyalgia

## 2017-07-12 ENCOUNTER — Encounter: Payer: Self-pay | Admitting: Physician Assistant

## 2017-07-12 ENCOUNTER — Ambulatory Visit (INDEPENDENT_AMBULATORY_CARE_PROVIDER_SITE_OTHER): Payer: Managed Care, Other (non HMO) | Admitting: Physician Assistant

## 2017-07-12 VITALS — BP 118/82 | HR 79 | Temp 98.5°F | Resp 14 | Ht 60.0 in | Wt 214.0 lb

## 2017-07-12 DIAGNOSIS — M797 Fibromyalgia: Secondary | ICD-10-CM

## 2017-07-12 DIAGNOSIS — M5442 Lumbago with sciatica, left side: Secondary | ICD-10-CM | POA: Diagnosis not present

## 2017-07-12 MED ORDER — TRAMADOL HCL 50 MG PO TABS
50.0000 mg | ORAL_TABLET | Freq: Two times a day (BID) | ORAL | 0 refills | Status: DC | PRN
Start: 2017-07-12 — End: 2017-11-07

## 2017-07-12 NOTE — Progress Notes (Signed)
Pre visit review using our clinic review tool, if applicable. No additional management support is needed unless otherwise documented below in the visit note. 

## 2017-07-12 NOTE — Progress Notes (Signed)
Patient presents to clinic today for follow-up regarding pain secondary to her chronic fibromyalgia. At last visit patient was started on Tramadol for PRN use for flares of pain. Has taken on infrequent basis when needed with great improvement in symptoms. Has been seeing a specialist for PRP injections. Has had 3 total with overall improvement. Has follow-up scheduled in 2 weeks. Patient does endorse mild flare of sciatica starting this morning, running down left leg. Denies numbness, weakness, change to bowel/bladder habits or saddle anesthesia.   Past Medical History:  Diagnosis Date  . Allergy   . Anxiety   . DDD (degenerative disc disease), lumbar   . Depression   . DJD (degenerative joint disease)    Left foot  . Fibromyalgia   . GERD (gastroesophageal reflux disease)   . GIST, non-malignant   . Herniated disc   . History of nephrolithiasis   . Hyperlipidemia   . IBS (irritable bowel syndrome)   . RLS (restless legs syndrome)   . Sciatica    recurrent left    Current Outpatient Prescriptions on File Prior to Visit  Medication Sig Dispense Refill  . diazepam (VALIUM) 2 MG tablet TAKE 1 TABLET BY MOUTH EVERY 12 HOURS AS NEEDED FOR ANXIETY 15 tablet 1  . famotidine (PEPCID) 10 MG tablet Take 10 mg by mouth.    . fluticasone (FLONASE) 50 MCG/ACT nasal spray Place 2 sprays into the nose daily.    Marland Kitchen loratadine-pseudoephedrine (CLARITIN-D 12 HOUR) 5-120 MG tablet Take 1 tablet by mouth 2 (two) times daily. 30 tablet 0  . amitriptyline (ELAVIL) 10 MG tablet TAKE 1 TABLET BY MOUTH EVERYDAY AT BEDTIME  1   No current facility-administered medications on file prior to visit.     Allergies  Allergen Reactions  . Hydrocodone-Acetaminophen     REACTION: heart palpations  . Levofloxacin Other (See Comments)    She had an outer body experience  . Other     White Steri Strips-Rash  . Keflex [Cephalexin] Rash    Family History  Problem Relation Age of Onset  . Hypertension  Mother   . Stroke Mother   . Heart disease Father   . Hyperlipidemia Father   . Asthma Other        father, grandmother  . Cancer Other        skin, breast, colon, uterine, renal    Social History   Social History  . Marital status: Married    Spouse name: N/A  . Number of children: N/A  . Years of education: N/A   Social History Main Topics  . Smoking status: Never Smoker  . Smokeless tobacco: Never Used  . Alcohol use No  . Drug use: No  . Sexual activity: Not Currently   Other Topics Concern  . 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 All healthy   Review of Systems - See HPI.  All other ROS are negative.  BP 118/82   Pulse 79   Temp 98.5 F (36.9 C) (Oral)   Resp 14   Ht 5' (1.524 m)   Wt 214 lb (97.1 kg)   SpO2 96%   BMI 41.79 kg/m   Physical Exam  Constitutional: She is oriented to person, place, and time and well-developed, well-nourished, and in no distress.  HENT:  Head: Normocephalic and atraumatic.  Cardiovascular: Normal rate, regular rhythm, normal heart sounds and  intact distal pulses.   Pulmonary/Chest: Effort normal and breath sounds normal. No respiratory distress. She has no wheezes. She has no rales. She exhibits no tenderness.  Musculoskeletal:       Thoracic back: Normal.       Lumbar back: She exhibits tenderness and pain. She exhibits no bony tenderness and no spasm.  Neurological: She is alert and oriented to person, place, and time.  Skin: Skin is warm and dry. No rash noted.  Vitals reviewed.  Assessment/Plan: Fibromyalgia Improved with PRN Tramadol. Is getting PRP injections with some benefit. Will have her follow-up with specialist. Medication filled. Patient to schedule CPE.   Acute left-sided low back pain with left-sided sciatica Tramadol refilled. Supportive measures reviewed. Declines other medication today. Will start rehab exercises for flare.  Follow-up if not resolving.     Leeanne Rio, PA-C

## 2017-07-12 NOTE — Patient Instructions (Signed)
Please continue chronic medications as directed. Follow-up with back specialists as scheduled.  We will continue Tramadol on PRN basis for now. Start stretching exercises below for when you have mild flares of sciatica.   Sciatica Rehab Ask your health care provider which exercises are safe for you. Do exercises exactly as told by your health care provider and adjust them as directed. It is normal to feel mild stretching, pulling, tightness, or discomfort as you do these exercises, but you should stop right away if you feel sudden pain or your pain gets worse.Do not begin these exercises until told by your health care provider. Stretching and range of motion exercises These exercises warm up your muscles and joints and improve the movement and flexibility of your hips and your back. These exercises also help to relieve pain, numbness, and tingling. Exercise A: Sciatic nerve glide 1. Sit in a chair with your head facing down toward your chest. Place your hands behind your back. Let your shoulders slump forward. 2. Slowly straighten one of your knees while you tilt your head back as if you are looking toward the ceiling. Only straighten your leg as far as you can without making your symptoms worse. 3. Hold for __________ seconds. 4. Slowly return to the starting position. 5. Repeat with your other leg. Repeat __________ times. Complete this exercise __________ times a day. Exercise B: Knee to chest with hip adduction and internal rotation  1. Lie on your back on a firm surface with both legs straight. 2. Bend one of your knees and move it up toward your chest until you feel a gentle stretch in your lower back and buttock. Then, move your knee toward the shoulder that is on the opposite side from your leg. ? Hold your leg in this position by holding onto the front of your knee. 3. Hold for __________ seconds. 4. Slowly return to the starting position. 5. Repeat with your other leg. Repeat  __________ times. Complete this exercise __________ times a day. Exercise C: Prone extension on elbows  1. Lie on your abdomen on a firm surface. A bed may be too soft for this exercise. 2. Prop yourself up on your elbows. 3. Use your arms to help lift your chest up until you feel a gentle stretch in your abdomen and your lower back. ? This will place some of your body weight on your elbows. If this is uncomfortable, try stacking pillows under your chest. ? Your hips should stay down, against the surface that you are lying on. Keep your hip and back muscles relaxed. 4. Hold for __________ seconds. 5. Slowly relax your upper body and return to the starting position. Repeat __________ times. Complete this exercise __________ times a day. Strengthening exercises These exercises build strength and endurance in your back. Endurance is the ability to use your muscles for a long time, even after they get tired. Exercise D: Pelvic tilt 1. Lie on your back on a firm surface. Bend your knees and keep your feet flat. 2. Tense your abdominal muscles. Tip your pelvis up toward the ceiling and flatten your lower back into the floor. ? To help with this exercise, you may place a small towel under your lower back and try to push your back into the towel. 3. Hold for __________ seconds. 4. Let your muscles relax completely before you repeat this exercise. Repeat __________ times. Complete this exercise __________ times a day. Exercise E: Alternating arm and leg raises  1. Get  on your hands and knees on a firm surface. If you are on a hard floor, you may want to use padding to cushion your knees, such as an exercise mat. 2. Line up your arms and legs. Your hands should be below your shoulders, and your knees should be below your hips. 3. Lift your left leg behind you. At the same time, raise your right arm and straighten it in front of you. ? Do not lift your leg higher than your hip. ? Do not lift your arm  higher than your shoulder. ? Keep your abdominal and back muscles tight. ? Keep your hips facing the ground. ? Do not arch your back. ? Keep your balance carefully, and do not hold your breath. 4. Hold for __________ seconds. 5. Slowly return to the starting position and repeat with your right leg and your left arm. Repeat __________ times. Complete this exercise __________ times a day. Posture and body mechanics  Body mechanics refers to the movements and positions of your body while you do your daily activities. Posture is part of body mechanics. Good posture and healthy body mechanics can help to relieve stress in your body's tissues and joints. Good posture means that your spine is in its natural S-curve position (your spine is neutral), your shoulders are pulled back slightly, and your head is not tipped forward. The following are general guidelines for applying improved posture and body mechanics to your everyday activities. Standing   When standing, keep your spine neutral and your feet about hip-width apart. Keep a slight bend in your knees. Your ears, shoulders, and hips should line up.  When you do a task in which you stand in one place for a long time, place one foot up on a stable object that is 2-4 inches (5-10 cm) high, such as a footstool. This helps keep your spine neutral. Sitting   When sitting, keep your spine neutral and keep your feet flat on the floor. Use a footrest, if necessary, and keep your thighs parallel to the floor. Avoid rounding your shoulders, and avoid tilting your head forward.  When working at a desk or a computer, keep your desk at a height where your hands are slightly lower than your elbows. Slide your chair under your desk so you are close enough to maintain good posture.  When working at a computer, place your monitor at a height where you are looking straight ahead and you do not have to tilt your head forward or downward to look at the  screen. Resting   When lying down and resting, avoid positions that are most painful for you.  If you have pain with activities such as sitting, bending, stooping, or squatting (flexion-based activities), lie in a position in which your body does not bend very much. For example, avoid curling up on your side with your arms and knees near your chest (fetal position).  If you have pain with activities such as standing for a long time or reaching with your arms (extension-based activities), lie with your spine in a neutral position and bend your knees slightly. Try the following positions: ? Lying on your side with a pillow between your knees. ? Lying on your back with a pillow under your knees. Lifting   When lifting objects, keep your feet at least shoulder-width apart and tighten your abdominal muscles.  Bend your knees and hips and keep your spine neutral. It is important to lift using the strength of your legs,  not your back. Do not lock your knees straight out.  Always ask for help to lift heavy or awkward objects. This information is not intended to replace advice given to you by your health care provider. Make sure you discuss any questions you have with your health care provider. Document Released: 11/15/2005 Document Revised: 07/22/2016 Document Reviewed: 08/01/2015 Elsevier Interactive Patient Education  [employer].

## 2017-07-12 NOTE — Assessment & Plan Note (Signed)
Improved with PRN Tramadol. Is getting PRP injections with some benefit. Will have her follow-up with specialist. Medication filled. Patient to schedule CPE.

## 2017-07-12 NOTE — Assessment & Plan Note (Signed)
Tramadol refilled. Supportive measures reviewed. Declines other medication today. Will start rehab exercises for flare. Follow-up if not resolving.

## 2017-11-07 ENCOUNTER — Other Ambulatory Visit: Payer: Self-pay | Admitting: Physician Assistant

## 2017-12-01 ENCOUNTER — Encounter: Payer: Self-pay | Admitting: Family Medicine

## 2017-12-01 ENCOUNTER — Ambulatory Visit (INDEPENDENT_AMBULATORY_CARE_PROVIDER_SITE_OTHER): Payer: Managed Care, Other (non HMO) | Admitting: Family Medicine

## 2017-12-01 VITALS — BP 112/71 | HR 86 | Temp 98.3°F | Resp 16 | Ht 60.0 in | Wt 213.5 lb

## 2017-12-01 DIAGNOSIS — J0100 Acute maxillary sinusitis, unspecified: Secondary | ICD-10-CM | POA: Diagnosis not present

## 2017-12-01 MED ORDER — AMOXICILLIN 875 MG PO TABS: 875.0000 mg | ORAL_TABLET | Freq: Two times a day (BID) | ORAL | 0 refills | Status: AC

## 2017-12-01 NOTE — Progress Notes (Signed)
OFFICE VISIT  12/01/2017   CC:  Chief Complaint  Patient presents with  . Sinus Infection    ?   HPI:    Patient is a 57 y.o.  female pt with a history of allergic rhinitis who presents for "sinus infection". Onset 8 d/a of resp illness.   Sneezing, facial pressure, coughing up dark yellow, thick mucous.  Some scratchy throat, mild HA.  Some ear pressure intermittently. PND cough.  No wheezing or SOB.  Subjective fever "early on" but not lately. Claritin D has been tried.  Saline nasal spray. Loss of taste and smell lately. Sx's not improving any.   Past Medical History:  Diagnosis Date  . Allergy   . Anxiety   . DDD (degenerative disc disease), lumbar   . Depression   . DJD (degenerative joint disease)    Left foot  . Fibromyalgia   . GERD (gastroesophageal reflux disease)   . GIST, non-malignant   . Herniated disc   . History of nephrolithiasis   . Hyperlipidemia   . IBS (irritable bowel syndrome)   . RLS (restless legs syndrome)   . Sciatica    recurrent left    Past Surgical History:  Procedure Laterality Date  . ABDOMINAL HYSTERECTOMY    . CESAREAN SECTION    . LEFT OOPHORECTOMY    . MASTECTOMY Bilateral    Double  . 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 s/p basket extraction renal stone  1990's  . s/p skin cancer- "pre-melanoma"- right ear    . TONSILLECTOMY    . TUBAL LIGATION      Outpatient Medications Prior to Visit  Medication Sig Dispense Refill  . diazepam (VALIUM) 2 MG tablet TAKE 1 TABLET BY MOUTH EVERY 12 HOURS AS NEEDED FOR ANXIETY 15 tablet 1  . famotidine (PEPCID) 10 MG tablet Take 10 mg by mouth.    . fluticasone (FLONASE) 50 MCG/ACT nasal spray Place 2 sprays into the nose daily.    Marland Kitchen loratadine-pseudoephedrine (CLARITIN-D 12 HOUR) 5-120 MG tablet Take 1 tablet by mouth 2 (two) times daily. 30 tablet 0  . traMADol (ULTRAM) 50 MG tablet TAKE 1 TABLET BY MOUTH EVERY 12 HOURS AS NEEDED 30 tablet 0  .  amitriptyline (ELAVIL) 10 MG tablet TAKE 1 TABLET BY MOUTH EVERYDAY AT BEDTIME  1   No facility-administered medications prior to visit.     Allergies  Allergen Reactions  . Hydrocodone-Acetaminophen     REACTION: heart palpations  . Levofloxacin Other (See Comments)    She had an outer body experience  . Other     White Steri Strips-Rash  . Keflex [Cephalexin] Rash  . Lidocaine Palpitations    ROS As per HPI  PE: Blood pressure 112/71, pulse 86, temperature 98.3 F (36.8 C), temperature source Oral, resp. rate 16, height 5' (1.524 m), weight 213 lb 8 oz (96.8 kg), SpO2 96 %. VS: noted--normal. Gen: alert, NAD, NONTOXIC APPEARING. HEENT: eyes without injection, drainage, or swelling.  Ears: EACs clear, TMs with normal light reflex and landmarks.  Nose: Clear rhinorrhea, with some dried, crusty exudate adherent to mildly injected mucosa.  No purulent d/c.  Mild L paranasal sinus and frontal sinus TTP.  No facial swelling.  Throat and mouth without focal lesion.  No pharyngial swelling, erythema, or exudate.   Neck: supple, no LAD.   LUNGS: CTA bilat, nonlabored resps.   CV: RRR, no m/r/g. EXT: no c/c/e SKIN: no rash  LABS:  none  IMPRESSION AND PLAN:  Acute sinusitis, nonrecurrent. Amoxil 875mg  bid x 10d (patient states she has taken this multiple times and has had no problems with it). Continue nasal steroid spray, saline nasal spray, and Claritin D.  An After Visit Summary was printed and given to the patient.  FOLLOW UP: Return if symptoms worsen or fail to improve.  Signed:  Crissie Sickles, MD           12/01/2017

## 2017-12-19 LAB — HM COLONOSCOPY

## 2017-12-20 ENCOUNTER — Telehealth: Payer: Self-pay | Admitting: Internal Medicine

## 2017-12-20 NOTE — Telephone Encounter (Signed)
Rec'd from [facility] forwarded 3 pages to Dr.John Jeneen Rinks

## 2017-12-23 ENCOUNTER — Encounter: Payer: Self-pay | Admitting: Emergency Medicine

## 2018-01-12 ENCOUNTER — Other Ambulatory Visit: Payer: Self-pay | Admitting: Physician Assistant

## 2018-01-18 ENCOUNTER — Encounter: Payer: Self-pay | Admitting: Emergency Medicine

## 2018-01-29 ENCOUNTER — Other Ambulatory Visit: Payer: Self-pay | Admitting: Family Medicine

## 2018-01-30 ENCOUNTER — Encounter: Payer: Self-pay | Admitting: Emergency Medicine

## 2018-01-30 NOTE — Telephone Encounter (Signed)
I will send in a refill of 10 tablets. No further Rx without follow-up.

## 2018-01-30 NOTE — Telephone Encounter (Signed)
Request for Diazepam  Last refill on 06/16/17 #15 1 RF Last OV: 07/12/17 No CSC or UDS

## 2018-01-30 NOTE — Telephone Encounter (Signed)
My chart message sent to patient advising is due for a physical an appointment

## 2018-09-27 ENCOUNTER — Encounter: Payer: Self-pay | Admitting: Physician Assistant

## 2018-09-27 ENCOUNTER — Ambulatory Visit (INDEPENDENT_AMBULATORY_CARE_PROVIDER_SITE_OTHER): Payer: Managed Care, Other (non HMO) | Admitting: Physician Assistant

## 2018-09-27 ENCOUNTER — Other Ambulatory Visit: Payer: Self-pay

## 2018-09-27 VITALS — BP 122/76 | HR 86 | Temp 97.6°F | Resp 14 | Ht 60.0 in | Wt 215.0 lb

## 2018-09-27 DIAGNOSIS — M7989 Other specified soft tissue disorders: Secondary | ICD-10-CM

## 2018-09-27 DIAGNOSIS — B9689 Other specified bacterial agents as the cause of diseases classified elsewhere: Secondary | ICD-10-CM

## 2018-09-27 DIAGNOSIS — J019 Acute sinusitis, unspecified: Secondary | ICD-10-CM

## 2018-09-27 MED ORDER — AMOXICILLIN-POT CLAVULANATE 875-125 MG PO TABS: 1.0000 | ORAL_TABLET | Freq: Two times a day (BID) | ORAL | 0 refills | Status: DC

## 2018-09-27 NOTE — Progress Notes (Signed)
Patient presents to clinic today c/o > 1 week of sinus pressure, tenderness with PND. Denies chest congestion, ear pain or tooth pain. Denies fever, chills, chest pain. Notes some windedness with moving around. Denies recent travel. Has taken Advil Cold/Sinus and Mucinex for symptomatic relief. Has been taking her chronic allergy medications.  Patient also noting some lumps on her lower left back that was evaluated by her GI specialist and diagnosed as lipomas. Would like second opinion regarding this.   Past Medical History:  Diagnosis Date  . Allergy   . Anxiety   . DDD (degenerative disc disease), lumbar   . Depression   . DJD (degenerative joint disease)    Left foot  . Fibromyalgia   . GERD (gastroesophageal reflux disease)   . GIST, non-malignant   . Herniated disc   . History of nephrolithiasis   . Hyperlipidemia   . IBS (irritable bowel syndrome)   . RLS (restless legs syndrome)   . Sciatica    recurrent left    Current Outpatient Medications on File Prior to Visit  Medication Sig Dispense Refill  . acetaminophen (TYLENOL) 500 MG tablet Take 500 mg by mouth every 6 (six) hours as needed.    . famotidine (PEPCID) 10 MG tablet Take 10 mg by mouth.    Marland Kitchen ibuprofen (ADVIL,MOTRIN) 200 MG tablet Take 200 mg by mouth every 6 (six) hours as needed.    . loratadine-pseudoephedrine (CLARITIN-D 12 HOUR) 5-120 MG tablet Take 1 tablet by mouth 2 (two) times daily. 30 tablet 0  . diazepam (VALIUM) 2 MG tablet TAKE 1 TABLET BY MOUTH EVERY 12 HOURS AS NEEDED FOR ANXIETY (Patient not taking: Reported on 09/27/2018) 10 tablet 0  . fluticasone (FLONASE) 50 MCG/ACT nasal spray Place 2 sprays into the nose daily.     No current facility-administered medications on file prior to visit.     Allergies  Allergen Reactions  . Hydrocodone-Acetaminophen     REACTION: heart palpations  . Levofloxacin Other (See Comments)    She had an outer body experience  . Other     White Steri  Strips-Rash  . Keflex [Cephalexin] Rash  . Lidocaine Palpitations    Family History  Problem Relation Age of Onset  . Hypertension Mother   . Stroke Mother   . Heart disease Father   . Hyperlipidemia Father   . Asthma Other        father, grandmother  . Cancer Other        skin, breast, colon, uterine, renal    Social History   Socioeconomic History  . Marital status: Married    Spouse name: Not on file  . Number of children: Not on file  . Years of education: Not on file  . Highest education level: Not on file  Occupational History  . Not on file  Social Needs  . Financial resource strain: Not on file  . Food insecurity:    Worry: Not on file    Inability: Not on file  . Transportation needs:    Medical: Not on file    Non-medical: Not on file  Tobacco Use  . Smoking status: Never Smoker  . Smokeless tobacco: Never Used  Substance and Sexual Activity  . Alcohol use: No  . Drug use: No  . Sexual activity: Not Currently  Lifestyle  . Physical activity:    Days per week: Not on file    Minutes per session: Not on file  . Stress: Not  on file  Relationships  . Social connections:    Talks on phone: Not on file    Gets together: Not on file    Attends religious service: Not on file    Active member of club or organization: Not on file    Attends meetings of clubs or organizations: Not on file    Relationship status: Not on file  Other Topics Concern  . 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 All healthy   Review of Systems - See HPI.  All other ROS are negative.  BP 122/76   Pulse 86   Temp 97.6 F (36.4 C) (Oral)   Resp 14   Ht 5' (1.524 m)   Wt 215 lb (97.5 kg)   SpO2 98%   BMI 41.99 kg/m   Physical Exam  Constitutional: She is oriented to person, place, and time. She appears well-developed and well-nourished.  HENT:  Head: Normocephalic and atraumatic.  Eyes:  Conjunctivae are normal.  Neck: Neck supple.  Cardiovascular: Normal rate, regular rhythm, normal heart sounds and intact distal pulses.  Pulmonary/Chest: Effort normal and breath sounds normal. No stridor. No respiratory distress. She has no wheezes. She has no rales. She exhibits no tenderness.  Neurological: She is alert and oriented to person, place, and time. No cranial nerve deficit.  Skin:     Psychiatric: She has a normal mood and affect.  Vitals reviewed.  Assessment/Plan: 1. Acute bacterial sinusitis Rx Augmentin.  Increase fluids.  Rest.  Saline nasal spray.  Probiotic.  Mucinex as directed.  Humidifier in bedroom.  Call or return to clinic if symptoms are not improving. - amoxicillin-clavulanate (AUGMENTIN) 875-125 MG tablet; Take 1 tablet by mouth 2 (two) times daily.  Dispense: 14 tablet; Refill: 0  2. Soft tissue mass Previously diagnosed as lipoma. Is deep, non-indurated and mobile. Cannot say for sure is a lipoma. This is likely but other benign masses cannot be truly ruled out without imaging and biopsy. Discussed imaging options but she wishes to hold off for now   Leeanne Rio, [place]

## 2018-09-27 NOTE — Patient Instructions (Signed)
Please take antibiotic as directed.  Increase fluid intake.  Use Saline nasal spray.  Take a daily multivitamin. Continue allergy medications.  Place a humidifier in the bedroom.  Please call or return clinic if symptoms are not improving.  Schedule a follow-up with Dr. Toy Care.    Sinusitis Sinusitis is redness, soreness, and swelling (inflammation) of the paranasal sinuses. Paranasal sinuses are air pockets within the bones of your face (beneath the eyes, the middle of the forehead, or above the eyes). In healthy paranasal sinuses, mucus is able to drain out, and air is able to circulate through them by way of your nose. However, when your paranasal sinuses are inflamed, mucus and air can become trapped. This can allow bacteria and other germs to grow and cause infection. Sinusitis can develop quickly and last only a short time (acute) or continue over a long period (chronic). Sinusitis that lasts for more than 12 weeks is considered chronic.  CAUSES  Causes of sinusitis include:  Allergies.  Structural abnormalities, such as displacement of the cartilage that separates your nostrils (deviated septum), which can decrease the air flow through your nose and sinuses and affect sinus drainage.  Functional abnormalities, such as when the small hairs (cilia) that line your sinuses and help remove mucus do not work properly or are not present. SYMPTOMS  Symptoms of acute and chronic sinusitis are the same. The primary symptoms are pain and pressure around the affected sinuses. Other symptoms include:  Upper toothache.  Earache.  Headache.  Bad breath.  Decreased sense of smell and taste.  A cough, which worsens when you are lying flat.  Fatigue.  Fever.  Thick drainage from your nose, which often is green and may contain pus (purulent).  Swelling and warmth over the affected sinuses. DIAGNOSIS  Your caregiver will perform a physical exam. During the exam, your caregiver may:  Look in  your nose for signs of abnormal growths in your nostrils (nasal polyps).  Tap over the affected sinus to check for signs of infection.  View the inside of your sinuses (endoscopy) with a special imaging device with a light attached (endoscope), which is inserted into your sinuses. If your caregiver suspects that you have chronic sinusitis, one or more of the following tests may be recommended:  Allergy tests.  Nasal culture A sample of mucus is taken from your nose and sent to a lab and screened for bacteria.  Nasal cytology A sample of mucus is taken from your nose and examined by your caregiver to determine if your sinusitis is related to an allergy. TREATMENT  Most cases of acute sinusitis are related to a viral infection and will resolve on their own within 10 days. Sometimes medicines are prescribed to help relieve symptoms (pain medicine, decongestants, nasal steroid sprays, or saline sprays).  However, for sinusitis related to a bacterial infection, your caregiver will prescribe antibiotic medicines. These are medicines that will help kill the bacteria causing the infection.  Rarely, sinusitis is caused by a fungal infection. In theses cases, your caregiver will prescribe antifungal medicine. For some cases of chronic sinusitis, surgery is needed. Generally, these are cases in which sinusitis recurs more than 3 times per year, despite other treatments. HOME CARE INSTRUCTIONS   Drink plenty of water. Water helps thin the mucus so your sinuses can drain more easily.  Use a humidifier.  Inhale steam 3 to 4 times a day (for example, sit in the bathroom with the shower running).  Apply a  warm, moist washcloth to your face 3 to 4 times a day, or as directed by your caregiver.  Use saline nasal sprays to help moisten and clean your sinuses.  Take over-the-counter or prescription medicines for pain, discomfort, or fever only as directed by your caregiver. SEEK IMMEDIATE MEDICAL CARE  IF:  You have increasing pain or severe headaches.  You have nausea, vomiting, or drowsiness.  You have swelling around your face.  You have vision problems.  You have a stiff neck.  You have difficulty breathing. MAKE SURE YOU:   Understand these instructions.  Will watch your condition.  Will get help right away if you are not doing well or get worse. Document Released: 11/15/2005 Document Revised: 02/07/2012 Document Reviewed: 11/30/2011 [facility] Patient Information 2014 [place], [place].

## 2018-10-12 ENCOUNTER — Encounter: Payer: Managed Care, Other (non HMO) | Admitting: Physician Assistant

## 2019-03-21 ENCOUNTER — Encounter: Payer: Self-pay | Admitting: Physician Assistant

## 2019-10-04 DIAGNOSIS — N6320 Unspecified lump in the left breast, unspecified quadrant: Secondary | ICD-10-CM | POA: Insufficient documentation

## 2020-04-15 ENCOUNTER — Other Ambulatory Visit: Payer: Self-pay

## 2020-04-16 ENCOUNTER — Ambulatory Visit: Payer: Managed Care, Other (non HMO) | Admitting: Physician Assistant

## 2020-04-18 ENCOUNTER — Ambulatory Visit (INDEPENDENT_AMBULATORY_CARE_PROVIDER_SITE_OTHER): Payer: Managed Care, Other (non HMO) | Admitting: Physician Assistant

## 2020-04-18 ENCOUNTER — Encounter: Payer: Self-pay | Admitting: Physician Assistant

## 2020-04-18 ENCOUNTER — Other Ambulatory Visit: Payer: Managed Care, Other (non HMO)

## 2020-04-18 ENCOUNTER — Other Ambulatory Visit: Payer: Self-pay

## 2020-04-18 VITALS — BP 122/82 | HR 77 | Temp 98.1°F | Resp 16 | Ht 63.0 in | Wt 232.0 lb

## 2020-04-18 DIAGNOSIS — M2550 Pain in unspecified joint: Secondary | ICD-10-CM

## 2020-04-18 DIAGNOSIS — Z0001 Encounter for general adult medical examination with abnormal findings: Secondary | ICD-10-CM | POA: Diagnosis not present

## 2020-04-18 DIAGNOSIS — M797 Fibromyalgia: Secondary | ICD-10-CM | POA: Diagnosis not present

## 2020-04-18 DIAGNOSIS — Z0000 Encounter for general adult medical examination without abnormal findings: Secondary | ICD-10-CM

## 2020-04-18 LAB — CBC WITH DIFFERENTIAL/PLATELET
Basophils Absolute: 0.1 10*3/uL (ref 0.0–0.1)
Basophils Relative: 1 % (ref 0.0–3.0)
Eosinophils Absolute: 0.1 10*3/uL (ref 0.0–0.7)
Eosinophils Relative: 1.5 % (ref 0.0–5.0)
HCT: 37.5 % (ref 36.0–46.0)
Hemoglobin: 12.6 g/dL (ref 12.0–15.0)
Lymphocytes Relative: 38.3 % (ref 12.0–46.0)
Lymphs Abs: 2.6 10*3/uL (ref 0.7–4.0)
MCHC: 33.6 g/dL (ref 30.0–36.0)
MCV: 91.5 fl (ref 78.0–100.0)
Monocytes Absolute: 0.8 10*3/uL (ref 0.1–1.0)
Monocytes Relative: 11.3 % (ref 3.0–12.0)
Neutro Abs: 3.3 10*3/uL (ref 1.4–7.7)
Neutrophils Relative %: 47.9 % (ref 43.0–77.0)
Platelets: 245 10*3/uL (ref 150.0–400.0)
RBC: 4.1 Mil/uL (ref 3.87–5.11)
RDW: 12.9 % (ref 11.5–15.5)
WBC: 6.9 10*3/uL (ref 4.0–10.5)

## 2020-04-18 LAB — COMPREHENSIVE METABOLIC PANEL
ALT: 22 U/L (ref 0–35)
AST: 16 U/L (ref 0–37)
Albumin: 4 g/dL (ref 3.5–5.2)
Alkaline Phosphatase: 64 U/L (ref 39–117)
BUN: 15 mg/dL (ref 6–23)
CO2: 29 mEq/L (ref 19–32)
Calcium: 8.9 mg/dL (ref 8.4–10.5)
Chloride: 105 mEq/L (ref 96–112)
Creatinine, Ser: 0.51 mg/dL (ref 0.40–1.20)
GFR: 123.05 mL/min (ref >60.00)
Glucose, Bld: 102 mg/dL — ABNORMAL HIGH (ref 70–99)
Potassium: 4.2 mEq/L (ref 3.5–5.1)
Sodium: 138 mEq/L (ref 135–145)
Total Bilirubin: 0.4 mg/dL (ref 0.2–1.2)
Total Protein: 6.4 g/dL (ref 6.0–8.3)

## 2020-04-18 LAB — TSH: TSH: 1.21 u[IU]/mL (ref 0.35–4.50)

## 2020-04-18 LAB — LIPID PANEL
Cholesterol: 225 mg/dL — ABNORMAL HIGH (ref 0–200)
HDL: 47.3 mg/dL (ref >39.00)
LDL Cholesterol: 152 mg/dL — ABNORMAL HIGH (ref 0–99)
NonHDL: 177.84
Total CHOL/HDL Ratio: 5
Triglycerides: 127 mg/dL (ref 0.0–149.0)
VLDL: 25.4 mg/dL (ref 0.0–40.0)

## 2020-04-18 LAB — SEDIMENTATION RATE: Sed Rate: 21 mm/hr (ref 0–30)

## 2020-04-18 LAB — VITAMIN D 25 HYDROXY (VIT D DEFICIENCY, FRACTURES): VITD: 21.03 ng/mL — ABNORMAL LOW (ref 30.00–100.00)

## 2020-04-18 LAB — HEMOGLOBIN A1C: Hgb A1c MFr Bld: 5.9 % (ref 4.6–6.5)

## 2020-04-18 NOTE — Progress Notes (Signed)
Mainly   Patient presents to clinic today for annual exam.  Patient is fasting for labs.  Diet --patient endorses trying to work on this.  Notes one of her biggest issues is portion sizes.  2 meals a day with lunch being her larger female.  Does snack between meals..  Exercise --this is somewhat limited currently due to her arthritic pains and fibromyalgia.  Patient endorses she typically swims which helps with exercise and joint issues.  Has not been able to do so due to Covid.  Health Maintenance: Immunizations --up-to-date. Colonoscopy --up-to-date.  Last 12/19/2017 Mammogram --not a candidate.  Status post bilateral mastectomy and reconstruction. PAP --not a candidate.  Status post hysterectomy.   Past Medical History:  Diagnosis Date  . Allergy   . Anxiety   . DDD (degenerative disc disease), lumbar   . Depression   . DJD (degenerative joint disease)    Left foot  . Fibromyalgia   . GERD (gastroesophageal reflux disease)   . GIST, non-malignant   . Herniated disc   . History of nephrolithiasis   . Hyperlipidemia   . IBS (irritable bowel syndrome)   . RLS (restless legs syndrome)   . Sciatica    recurrent left    Past Surgical History:  Procedure Laterality Date  . ABDOMINAL HYSTERECTOMY    . CESAREAN SECTION    . LEFT OOPHORECTOMY    . MASTECTOMY Bilateral    Double  . 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 s/p basket extraction renal stone  1990's  . s/p skin cancer- "pre-melanoma"- right ear    . TONSILLECTOMY    . TUBAL LIGATION      Current Outpatient Medications on File Prior to Visit  Medication Sig Dispense Refill  . acetaminophen (TYLENOL) 500 MG tablet Take 500 mg by mouth every 6 (six) hours as needed.    . diclofenac (VOLTAREN) 75 MG EC tablet Take 75 mg by mouth 2 (two) times daily.    . fluticasone (FLONASE) 50 MCG/ACT nasal spray Place 2 sprays into the nose daily.    . pantoprazole (PROTONIX) 40 MG tablet Take by  mouth.    . predniSONE (DELTASONE) 20 MG tablet Take 20 mg by mouth 2 (two) times daily.    . diazepam (VALIUM) 2 MG tablet TAKE 1 TABLET BY MOUTH EVERY 12 HOURS AS NEEDED FOR ANXIETY (Patient not taking: Reported on 04/18/2020) 10 tablet 0   No current facility-administered medications on file prior to visit.    Allergies  Allergen Reactions  . Hydrocodone-Acetaminophen     REACTION: heart palpations  . Levofloxacin Other (See Comments)    She had an outer body experience  . Other     White Steri Strips-Rash  . Keflex [Cephalexin] Rash  . Lidocaine Palpitations    Family History  Problem Relation Age of Onset  . Hypertension Mother   . Stroke Mother   . Heart disease Father   . Hyperlipidemia Father   . Asthma Other        father, grandmother  . Cancer Other        skin, breast, colon, uterine, renal    Social History   Socioeconomic History  . Marital status: Married    Spouse name: Not on file  . Number of children: Not on file  . Years of education: Not on file  . Highest education level: Not on file  Occupational History  . Not on file  Tobacco Use  .  Smoking status: Never Smoker  . Smokeless tobacco: Never Used  Substance and Sexual Activity  . Alcohol use: No  . Drug use: No  . Sexual activity: Not Currently  Other Topics Concern  . 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 All healthy   Social Determinants of Health   Financial Resource Strain:   . Difficulty of Paying Living Expenses:   Food Insecurity:   . Worried About Charity fundraiser in the Last Year:   . Arboriculturist in the Last Year:   Transportation Needs:   . Film/video editor (Medical):   Marland Kitchen Lack of Transportation (Non-Medical):   Physical Activity:   . Days of Exercise per Week:   . Minutes of Exercise per Session:   Stress:   . Feeling of Stress :   Social Connections:   . Frequency of  Communication with Friends and Family:   . Frequency of Social Gatherings with Friends and Family:   . Attends Religious Services:   . Active Member of Clubs or Organizations:   . Attends Archivist Meetings:   Marland Kitchen Marital Status:   Intimate Partner Violence:   . Fear of Current or Ex-Partner:   . Emotionally Abused:   Marland Kitchen Physically Abused:   . Sexually Abused:    Review of Systems  Constitutional: Negative for fever and weight loss.  HENT: Negative for ear discharge, ear pain, hearing loss and tinnitus.   Eyes: Negative for blurred vision, double vision, photophobia and pain.  Respiratory: Negative for cough and shortness of breath.   Cardiovascular: Negative for chest pain and palpitations.  Gastrointestinal: Negative for abdominal pain, blood in stool, constipation, diarrhea, heartburn, melena, nausea and vomiting.  Genitourinary: Negative for dysuria, flank pain, frequency, hematuria and urgency.  Musculoskeletal: Positive for joint pain (Chronic.  Secondary to osteoarthritis.) and myalgias (Chronic.  Secondary to fibromyalgia). Negative for falls.  Neurological: Negative for dizziness, loss of consciousness and headaches.  Endo/Heme/Allergies: Negative for environmental allergies.  Psychiatric/Behavioral: Negative for depression, hallucinations, substance abuse and suicidal ideas. The patient is not nervous/anxious and does not have insomnia.    Resp 16   Ht '5\' 3"'$  (1.6 m)   Wt 232 lb (105.2 kg)   BMI 41.10 kg/m   Physical Exam Vitals reviewed.  HENT:     Head: Normocephalic and atraumatic.     Right Ear: Tympanic membrane, ear canal and external ear normal.     Left Ear: Tympanic membrane, ear canal and external ear normal.     Nose: Nose normal. No mucosal edema.     Mouth/Throat:     Pharynx: Uvula midline. No oropharyngeal exudate or posterior oropharyngeal erythema.  Eyes:     Conjunctiva/sclera: Conjunctivae normal.     Pupils: Pupils are equal, round, and  reactive to light.  Neck:     Thyroid: No thyromegaly.  Cardiovascular:     Rate and Rhythm: Normal rate and regular rhythm.     Heart sounds: Normal heart sounds.  Pulmonary:     Effort: Pulmonary effort is normal. No respiratory distress.     Breath sounds: Normal breath sounds. No wheezing or rales.  Abdominal:     General: Bowel sounds are normal. There is no distension.     Palpations: Abdomen is soft. There is no mass.     Tenderness: There is no abdominal tenderness. There is  no guarding or rebound.  Musculoskeletal:     Cervical back: Neck supple.  Lymphadenopathy:     Cervical: No cervical adenopathy.  Skin:    General: Skin is warm and dry.     Findings: No rash.  Neurological:     Mental Status: She is alert and oriented to person, place, and time.     Cranial Nerves: No cranial nerve deficit.     Assessment/Plan: 1. Visit for preventive health examination Depression screen negative. Health Maintenance reviewed. Preventive schedule discussed and handout given in AVS. Will obtain fasting labs today.  - Comprehensive metabolic panel - CBC with Differential/Platelet - Lipid panel - Hemoglobin A1c  2. Fibromyalgia Discussed potential for restarting a medication to help fibromyalgia.  Patient declines at present time.  Discuss in the meantime while she is unable to swim can consider stretching and tai chi.  3. Morbid obesity ([place]) Dietary and exercise recommendations reviewed with patient.  Will check labs today to further risk stratify.  Patient has current barriers including chronic pain and joint stiffness which we are working on. - Comprehensive metabolic panel - Lipid panel - Hemoglobin A1c - TSH - Vitamin D (25 hydroxy)  4. Arthralgia, unspecified joint Notes increase in affected joints.  Will reassess for rheumatological condition/inflammatory arthralgias.  Will check sed rate, RF and anti-CCP antibody.  Previously had negative ANA with former provider.   Declines any medication today. - Sedimentation rate - Rheumatoid Factor - Cyclic citrul peptide antibody, IgG ([facility])   Leeanne Rio, PA-C

## 2020-04-18 NOTE — Patient Instructions (Signed)
Please go to the lab for blood work.   Our office will call you with your results unless you have chosen to receive results via MyChart.  If your blood work is normal we will follow-up each year for physicals and as scheduled for chronic medical problems.  If anything is abnormal we will treat accordingly and get you in for a follow-up.  Sleep Hygiene  Do: (1) Go to bed at the same time each day. (2) Get up from bed at the same time each day. (3) Get regular exercise each day, preferably in the morning.  There is goof evidence that regular exercise improves restful sleep.  This includes stretching and aerobic exercise. (4) Get regular exposure to outdoor or bright lights, especially in the late afternoon. (5) Keep the temperature in your bedroom comfortable. (6) Keep the bedroom quiet when sleeping. (7) Keep the bedroom dark enough to facilitate sleep. (8) Use your bed only for sleep and sex. (9) Take medications as directed.  It is helpful to take prescribed sleeping pills 1 hour before bedtime, so they are causing drowsiness when you lie down, or 10 hours before getting up, to avoid daytime drowsiness. (10) Use a relaxation exercise just before going to sleep -- imagery, massage, warm bath. (11) Keep your feet and hands warm.  Wear warm socks and/or mittens or gloves to bed.  Don't: (1) Exercise just before going to bed. (2) Engage in stimulating activity just before bed, such as playing a competitive game, watching an exciting program on television, or having an important discussion with a loved one. (3) Have caffeine in the evening (coffee, teas, chocolate, sodas, etc.) (4) Read or watch television in bed. (5) Use alcohol to help you sleep. (6) Go to bed too hungry or too full. (7) Take another person's sleeping pills. (8) Take over-the-counter sleeping pills, without your doctor's knowledge.  Tolerance can develop rapidly with these medications.  Diphenhydramine can have serious  side effects for elderly patients. (9) Take daytime naps. (10) Command yourself to go to sleep.  This only makes your mind and body more alert.  If you lie awake for more than 20-30 minutes, get up, go to a different room, participate in a quiet activity (Ex - non-excitable reading or television), and then return to bed when you feel sleepy.  Do this as many times during the night as needed.  This may cause you to have a night or two of poor sleep but it will train your brain to know when it is time for sleep.   Preventive Care 61-39 Years Old, Female Preventive care refers to visits with your health care provider and lifestyle choices that can promote health and wellness. This includes:  A yearly physical exam. This may also be called an annual well check.  Regular dental visits and eye exams.  Immunizations.  Screening for certain conditions.  Healthy lifestyle choices, such as eating a healthy diet, getting regular exercise, not using drugs or products that contain nicotine and tobacco, and limiting alcohol use. What can I expect for my preventive care visit? Physical exam Your health care provider will check your:  Height and weight. This may be used to calculate body mass index (BMI), which tells if you are at a healthy weight.  Heart rate and blood pressure.  Skin for abnormal spots. Counseling Your health care provider may ask you questions about your:  Alcohol, tobacco, and drug use.  Emotional well-being.  Home and relationship well-being.  Sexual  activity.  Eating habits.  Work and work Statistician.  Method of birth control.  Menstrual cycle.  Pregnancy history. What immunizations do I need?  Influenza (flu) vaccine  This is recommended every year. Tetanus, diphtheria, and pertussis (Tdap) vaccine  You may need a Td booster every 10 years. Varicella (chickenpox) vaccine  You may need this if you have not been vaccinated. Zoster (shingles) vaccine   You may need this after age 46. Measles, mumps, and rubella (MMR) vaccine  You may need at least one dose of MMR if you were born in 1957 or later. You may also need a second dose. Pneumococcal conjugate (PCV13) vaccine  You may need this if you have certain conditions and were not previously vaccinated. Pneumococcal polysaccharide (PPSV23) vaccine  You may need one or two doses if you smoke cigarettes or if you have certain conditions. Meningococcal conjugate (MenACWY) vaccine  You may need this if you have certain conditions. Hepatitis A vaccine  You may need this if you have certain conditions or if you travel or work in places where you may be exposed to hepatitis A. Hepatitis B vaccine  You may need this if you have certain conditions or if you travel or work in places where you may be exposed to hepatitis B. Haemophilus influenzae type b (Hib) vaccine  You may need this if you have certain conditions. Human papillomavirus (HPV) vaccine  If recommended by your health care provider, you may need three doses over 6 months. You may receive vaccines as individual doses or as more than one vaccine together in one shot (combination vaccines). Talk with your health care provider about the risks and benefits of combination vaccines. What tests do I need? Blood tests  Lipid and cholesterol levels. These may be checked every 5 years, or more frequently if you are over 102 years old.  Hepatitis C test.  Hepatitis B test. Screening  Lung cancer screening. You may have this screening every year starting at age 62 if you have a 30-pack-year history of smoking and currently smoke or have quit within the past 15 years.  Colorectal cancer screening. All adults should have this screening starting at age 65 and continuing until age 54. Your health care provider may recommend screening at age 40 if you are at increased risk. You will have tests every 1-10 years, depending on your results and  the type of screening test.  Diabetes screening. This is done by checking your blood sugar (glucose) after you have not eaten for a while (fasting). You may have this done every 1-3 years.  Mammogram. This may be done every 1-2 years. Talk with your health care provider about when you should start having regular mammograms. This may depend on whether you have a family history of breast cancer.  BRCA-related cancer screening. This may be done if you have a family history of breast, ovarian, tubal, or peritoneal cancers.  Pelvic exam and Pap test. This may be done every 3 years starting at age 44. Starting at age 28, this may be done every 5 years if you have a Pap test in combination with an HPV test. Other tests  Sexually transmitted disease (STD) testing.  Bone density scan. This is done to screen for osteoporosis. You may have this scan if you are at high risk for osteoporosis. Follow these instructions at home: Eating and drinking  Eat a diet that includes fresh fruits and vegetables, whole grains, lean protein, and low-fat dairy.  Take  vitamin and mineral supplements as recommended by your health care provider.  Do not drink alcohol if: ? Your health care provider tells you not to drink. ? You are pregnant, may be pregnant, or are planning to become pregnant.  If you drink alcohol: ? Limit how much you have to 0-1 drink a day. ? Be aware of how much alcohol is in your drink. In the [place] one drink equals one 12 oz bottle of beer (355 mL), one 5 oz glass of wine (148 mL), or one 1 oz glass of hard liquor (44 mL). Lifestyle  Take daily care of your teeth and gums.  Stay active. Exercise for at least 30 minutes on 5 or more days each week.  Do not use any products that contain nicotine or tobacco, such as cigarettes, e-cigarettes, and chewing tobacco. If you need help quitting, ask your health care provider.  If you are sexually active, practice safe sex. Use a condom or other  form of birth control (contraception) in order to prevent pregnancy and STIs (sexually transmitted infections).  If told by your health care provider, take low-dose aspirin daily starting at age 66. What's next?  Visit your health care provider once a year for a well check visit.  Ask your health care provider how often you should have your eyes and teeth checked.  Stay up to date on all vaccines. This information is not intended to replace advice given to you by your health care provider. Make sure you discuss any questions you have with your health care provider. Document Revised: 07/27/2018 Document Reviewed: 07/27/2018 Elsevier Patient Education  [employer]. .

## 2020-04-21 ENCOUNTER — Other Ambulatory Visit: Payer: Self-pay

## 2020-04-21 DIAGNOSIS — R7989 Other specified abnormal findings of blood chemistry: Secondary | ICD-10-CM

## 2020-04-21 DIAGNOSIS — R945 Abnormal results of liver function studies: Secondary | ICD-10-CM

## 2020-04-21 DIAGNOSIS — E7800 Pure hypercholesterolemia, unspecified: Secondary | ICD-10-CM

## 2020-04-21 LAB — CYCLIC CITRUL PEPTIDE ANTIBODY, IGG: Cyclic Citrullin Peptide Ab: <16 UNITS

## 2020-04-21 LAB — RHEUMATOID FACTOR: Rheumatoid fact SerPl-aCnc: <14 IU/mL (ref <14)

## 2020-04-21 MED ORDER — VITAMIN D (ERGOCALCIFEROL) 1.25 MG (50000 UNIT) PO CAPS
50000.0000 [IU] | ORAL_CAPSULE | ORAL | 0 refills | Status: DC
Start: 2020-04-21 — End: 2020-05-30

## 2020-04-22 ENCOUNTER — Telehealth: Payer: Self-pay | Admitting: Physician Assistant

## 2020-04-22 DIAGNOSIS — M797 Fibromyalgia: Secondary | ICD-10-CM

## 2020-04-22 DIAGNOSIS — M2550 Pain in unspecified joint: Secondary | ICD-10-CM

## 2020-04-22 MED ORDER — DULOXETINE HCL 20 MG PO CPEP: 20.0000 mg | ORAL_CAPSULE | Freq: Every day | ORAL | 1 refills | Status: DC

## 2020-04-22 NOTE — Telephone Encounter (Signed)
In reviewing patient last visit. There were discussion of medication for arthralgias but patient declined at time of visit. Now she is wanting to start something for pain. Please advise

## 2020-04-22 NOTE — Telephone Encounter (Signed)
Spoke with patient of PCP recommendations. Patient is willing to start the Cymbalta daily for pain. Rx sent to the pharmacy. Will follow up in 4-6 weeks. She is agreeable.

## 2020-04-22 NOTE — Telephone Encounter (Signed)
I would recommend low dose (20 mg) of duloxetine daily as this works well for fibromyalgia and chronic MSK pain. If she is willing, ok to send in Rx 30 with 1 refills. Follow-up 4-6 weeks for reassessment.

## 2020-04-22 NOTE — Telephone Encounter (Signed)
Pt called in stating she is open to taking something for her pain. She states that should would like something that could take the edge off and not have a lot of long lasting side effects. Please advise

## 2020-05-29 ENCOUNTER — Telehealth: Payer: Self-pay | Admitting: Physician Assistant

## 2020-05-29 NOTE — Telephone Encounter (Signed)
Patient wants 50,000 units of vitamin D  Refilled - states she is still feeling fatigued.  She wants to go to 50,000 twice a week until she comes back for her blood work.  Please advise

## 2020-05-29 NOTE — Telephone Encounter (Signed)
Please advise 

## 2020-05-29 NOTE — Telephone Encounter (Signed)
Would have her increase her OTC D3 to 2000 units daily. Ok to extend once weekly Ergocalciferol by 4 weeks. She is not to take more than once weekly. We do not want Vitamin D levels to get too high as this can cause toxicity.

## 2020-05-30 ENCOUNTER — Other Ambulatory Visit: Payer: Self-pay

## 2020-05-30 MED ORDER — VITAMIN D (ERGOCALCIFEROL) 1.25 MG (50000 UNIT) PO CAPS: 50000.0000 [IU] | ORAL_CAPSULE | ORAL | 0 refills | Status: AC

## 2020-05-30 NOTE — Telephone Encounter (Signed)
Called patient in reference to PCP's recommendations. Patient voiced understanding. Rx sent to the pharmacy.

## 2020-06-16 ENCOUNTER — Ambulatory Visit: Payer: Managed Care, Other (non HMO)

## 2020-06-19 ENCOUNTER — Other Ambulatory Visit: Payer: Self-pay | Admitting: Physician Assistant

## 2020-06-19 DIAGNOSIS — M2550 Pain in unspecified joint: Secondary | ICD-10-CM

## 2020-06-19 DIAGNOSIS — M797 Fibromyalgia: Secondary | ICD-10-CM

## 2020-11-19 ENCOUNTER — Ambulatory Visit
Admission: EM | Admit: 2020-11-19 | Discharge: 2020-11-19 | Disposition: A | Discharge status: Home / Self Care | Payer: Managed Care, Other (non HMO) | Attending: Emergency Medicine | Admitting: Emergency Medicine

## 2020-11-19 ENCOUNTER — Other Ambulatory Visit: Payer: Self-pay

## 2020-11-19 DIAGNOSIS — S29019A Strain of muscle and tendon of unspecified wall of thorax, initial encounter: Secondary | ICD-10-CM | POA: Diagnosis not present

## 2020-11-19 MED ORDER — KETOROLAC TROMETHAMINE 30 MG/ML IJ SOLN
30.0000 mg | Freq: Once | INTRAMUSCULAR | Status: AC
  Administered 2020-11-19: 30 mg via INTRAMUSCULAR

## 2020-11-19 MED ORDER — ALUM & MAG HYDROXIDE-SIMETH 200-200-20 MG/5ML PO SUSP
30.0000 mL | Freq: Once | ORAL | Status: AC
  Administered 2020-11-19: 30 mL via ORAL

## 2020-11-19 MED ORDER — TIZANIDINE HCL 2 MG PO TABS: 2.0000 mg | ORAL_TABLET | Freq: Four times a day (QID) | ORAL | 0 refills | Status: AC | PRN
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
## 2020-11-20 ENCOUNTER — Encounter: Payer: Self-pay | Admitting: Emergency Medicine

## 2021-04-01 ENCOUNTER — Encounter (HOSPITAL_BASED_OUTPATIENT_CLINIC_OR_DEPARTMENT_OTHER): Payer: Self-pay | Admitting: Emergency Medicine

## 2021-04-01 ENCOUNTER — Emergency Department (HOSPITAL_BASED_OUTPATIENT_CLINIC_OR_DEPARTMENT_OTHER): Payer: BC Managed Care – PPO

## 2021-04-01 ENCOUNTER — Other Ambulatory Visit: Payer: Self-pay

## 2021-04-01 ENCOUNTER — Emergency Department (HOSPITAL_BASED_OUTPATIENT_CLINIC_OR_DEPARTMENT_OTHER)
Admission: EM | Admit: 2021-04-01 | Discharge: 2021-04-01 | Disposition: A | Discharge status: Home / Self Care | Payer: BC Managed Care – PPO | Attending: Emergency Medicine | Admitting: Emergency Medicine

## 2021-04-01 DIAGNOSIS — R0602 Shortness of breath: Secondary | ICD-10-CM | POA: Insufficient documentation

## 2021-04-01 DIAGNOSIS — R1010 Upper abdominal pain, unspecified: Secondary | ICD-10-CM | POA: Insufficient documentation

## 2021-04-01 DIAGNOSIS — R072 Precordial pain: Secondary | ICD-10-CM | POA: Diagnosis present

## 2021-04-01 DIAGNOSIS — R079 Chest pain, unspecified: Secondary | ICD-10-CM

## 2021-04-01 DIAGNOSIS — Z85828 Personal history of other malignant neoplasm of skin: Secondary | ICD-10-CM | POA: Insufficient documentation

## 2021-04-01 DIAGNOSIS — R002 Palpitations: Secondary | ICD-10-CM

## 2021-04-01 DIAGNOSIS — R110 Nausea: Secondary | ICD-10-CM | POA: Insufficient documentation

## 2021-04-01 LAB — CBC WITH DIFFERENTIAL/PLATELET
Abs Immature Granulocytes: 0.01 10*3/uL (ref 0.00–0.07)
Basophils Absolute: 0.1 10*3/uL (ref 0.0–0.1)
Basophils Relative: 1 %
Eosinophils Absolute: 0.1 10*3/uL (ref 0.0–0.5)
Eosinophils Relative: 2 %
HCT: 40.1 % (ref 36.0–46.0)
Hemoglobin: 13.3 g/dL (ref 12.0–15.0)
Immature Granulocytes: 0 %
Lymphocytes Relative: 43 %
Lymphs Abs: 3 10*3/uL (ref 0.7–4.0)
MCH: 30.3 pg (ref 26.0–34.0)
MCHC: 33.2 g/dL (ref 30.0–36.0)
MCV: 91.3 fL (ref 80.0–100.0)
Monocytes Absolute: 0.6 10*3/uL (ref 0.1–1.0)
Monocytes Relative: 9 %
Neutro Abs: 3.2 10*3/uL (ref 1.7–7.7)
Neutrophils Relative %: 45 %
Platelets: 256 10*3/uL (ref 150–400)
RBC: 4.39 MIL/uL (ref 3.87–5.11)
RDW: 12.4 % (ref 11.5–15.5)
WBC: 7 10*3/uL (ref 4.0–10.5)
nRBC: 0 % (ref 0.0–0.2)

## 2021-04-01 LAB — MAGNESIUM: Magnesium: 1.9 mg/dL (ref 1.7–2.4)

## 2021-04-01 LAB — COMPREHENSIVE METABOLIC PANEL
ALT: 24 U/L (ref 0–44)
AST: 17 U/L (ref 15–41)
Albumin: 4.1 g/dL (ref 3.5–5.0)
Alkaline Phosphatase: 62 U/L (ref 38–126)
Anion gap: 10 (ref 5–15)
BUN: 15 mg/dL (ref 6–20)
CO2: 24 mmol/L (ref 22–32)
Calcium: 9.4 mg/dL (ref 8.9–10.3)
Chloride: 105 mmol/L (ref 98–111)
Creatinine, Ser: 0.54 mg/dL (ref 0.44–1.00)
GFR, Estimated: >60 mL/min (ref >60)
Glucose, Bld: 111 mg/dL — ABNORMAL HIGH (ref 70–99)
Potassium: 3.8 mmol/L (ref 3.5–5.1)
Sodium: 139 mmol/L (ref 135–145)
Total Bilirubin: 0.6 mg/dL (ref 0.3–1.2)
Total Protein: 6.6 g/dL (ref 6.5–8.1)

## 2021-04-01 LAB — TROPONIN I (HIGH SENSITIVITY)
Troponin I (High Sensitivity): 2 ng/L (ref <18)
Troponin I (High Sensitivity): 2 ng/L (ref <18)

## 2021-04-01 LAB — TSH: TSH: 0.546 u[IU]/mL (ref 0.350–4.500)

## 2021-04-01 LAB — D-DIMER, QUANTITATIVE: D-Dimer, Quant: 0.36 ug/mL-FEU (ref 0.00–0.50)

## 2021-04-01 MED ORDER — ASPIRIN 81 MG PO CHEW
324.0000 mg | CHEWABLE_TABLET | Freq: Once | ORAL | Status: AC
  Administered 2021-04-01: 324 mg via ORAL
  Filled 2021-04-01: qty 4

## 2021-04-01 NOTE — ED Triage Notes (Signed)
Pt reports shortness of breath and "skipping beat " started yesterday, central/epigastric chest discomfort . Back pain. Hx GERD.

## 2021-04-01 NOTE — Discharge Instructions (Addendum)
Seen in the emergency department for some irregular heartbeats and some chest tightness.  You had blood work EKG and lab work that did not show any evidence of heart injury.  Please contact your primary care doctor as they may want to set you up with a temporary heart monitor.  Continue your regular medications.  Return to the emergency department for any worsening or concerning symptoms

## 2021-04-01 NOTE — ED Provider Notes (Signed)
[facility] Provider Note   CSN: [id number] Arrival date & time: 04/01/21  0921     History Chief Complaint  Patient presents with  . Shortness of Breath    Jody Walker is a 60 y.o. female.  She is here with some upper abdominal lower chest pressure.  She has had a recent GI work-up and is now on Protonix and Bentyl.  She says this feels a little bit like that but a little bit higher.  She also notes she feels a little more short of breath and her heart rates been more elevated today.  The history is provided by the patient.  Chest Pain Pain location:  Substernal area Pain quality: pressure   Pain radiates to:  Does not radiate Pain severity:  Moderate Onset quality:  Gradual Timing:  Constant Progression:  Unchanged Chronicity:  Recurrent Relieved by:  None tried Worsened by:  Nothing Ineffective treatments:  None tried Associated symptoms: abdominal pain, nausea, palpitations and shortness of breath   Associated symptoms: no cough, no diaphoresis, no fever, no headache and no vomiting   Risk factors: high cholesterol   Risk factors: no smoking        Past Medical History:  Diagnosis Date  . Allergy   . Anxiety   . DDD (degenerative disc disease), lumbar   . Depression   . DJD (degenerative joint disease)    Left foot  . Fibromyalgia   . GERD (gastroesophageal reflux disease)   . GIST, non-malignant   . Herniated disc   . History of nephrolithiasis   . Hyperlipidemia   . IBS (irritable bowel syndrome)   . RLS (restless legs syndrome)   . Sciatica    recurrent left    Patient Active Problem List   Diagnosis Date Noted  . Acute left-sided low back pain with left-sided sciatica 07/12/2017  . Visit for preventive health examination 12/13/2016  . 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 Anxiety and depression 10/15/2016  . HYPERLIPIDEMIA 08/06/2008  . OBSTRUCTIVE SLEEP APNEA 08/06/2008  . RESTLESS LEGS  SYNDROME 08/06/2008  . ALLERGIC RHINITIS 08/06/2008  . GERD 08/06/2008  . IBS 08/06/2008  . Fibromyalgia 08/06/2008  . NEPHROLITHIASIS, HX OF 08/06/2008    Past Surgical History:  Procedure Laterality Date  . ABDOMINAL HYSTERECTOMY    . CESAREAN SECTION    . LEFT OOPHORECTOMY    . MASTECTOMY Bilateral    Double  . 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 s/p basket extraction renal stone  1990's  . s/p skin cancer- "pre-melanoma"- right ear    . TONSILLECTOMY    . TUBAL LIGATION       OB History   No obstetric history on file.     Family History  Problem Relation Age of Onset  . Hypertension Mother   . Stroke Mother   . Heart disease Father   . Hyperlipidemia Father   . Asthma Other        father, grandmother  . Cancer Other        skin, breast, colon, uterine, renal    Social History   Tobacco Use  . Smoking status: Never Smoker  . Smokeless tobacco: Never Used  Vaping Use  . Vaping Use: Never used  Substance Use Topics  . Alcohol use: No  . Drug use: No    Home Medications Prior to Admission medications   Medication Sig Start Date End Date Taking? Authorizing Provider  acetaminophen (TYLENOL) 500 MG tablet Take 500 mg by mouth every 6 (six) hours as needed.    [provider]  diazepam (VALIUM) 2 MG tablet TAKE 1 TABLET BY MOUTH EVERY 12 HOURS AS NEEDED FOR ANXIETY Patient not taking: Reported on 04/18/2020 01/30/18   Brunetta Jeans, PA-C  diclofenac (VOLTAREN) 75 MG EC tablet Take 75 mg by mouth 2 (two) times daily. 04/15/20   [provider]  DULoxetine (CYMBALTA) 20 MG capsule TAKE 1 CAPSULE BY MOUTH EVERY DAY 06/19/20   Brunetta Jeans, PA-C  fluticasone [facility]) 50 MCG/ACT nasal spray Place 2 sprays into the nose daily.    [provider]  Vitamin D, Ergocalciferol, (DRISDOL) 1.25 MG (50000 UNIT) CAPS capsule Take 1 capsule (50,000 Units total) by mouth every 7 (seven) days. 05/30/20   Brunetta Jeans, PA-C    Allergies    Hydrocodone-acetaminophen, Levofloxacin, Other, Keflex [cephalexin], and Lidocaine  Review of Systems   Review of Systems  Constitutional: Negative for diaphoresis and fever.  HENT: Negative for sore throat.   Eyes: Negative for visual disturbance.  Respiratory: Positive for shortness of breath. Negative for cough.   Cardiovascular: Positive for chest pain and palpitations.  Gastrointestinal: Positive for abdominal pain and nausea. Negative for vomiting.  Genitourinary: Negative for dysuria.  Musculoskeletal: Negative for neck pain.  Skin: Negative for rash.  Neurological: Negative for headaches.    Physical Exam Updated Vital Signs BP (!) 148/87 (BP Location: Right Arm)   Pulse (!) 108   Temp 98.6 F (37 C) (Oral)   Resp 20   Ht 5\' 2"  (1.575 m)   Wt 104.3 kg   SpO2 100%   BMI 42.07 kg/m   Physical Exam Vitals and nursing note reviewed.  Constitutional:      General: She is not in acute distress.    Appearance: Normal appearance. She is well-developed.  HENT:     Head: Normocephalic and atraumatic.  Eyes:     Conjunctiva/sclera: Conjunctivae normal.  Cardiovascular:     Rate and Rhythm: Normal rate and regular rhythm.     Heart sounds: No murmur heard.   Pulmonary:     Effort: Pulmonary effort is normal. No respiratory distress.     Breath sounds: Normal breath sounds.  Abdominal:     Palpations: Abdomen is soft.     Tenderness: There is no abdominal tenderness.  Musculoskeletal:     Cervical back: Neck supple.     Right lower leg: No tenderness. No edema.     Left lower leg: No tenderness. No edema.  Skin:    General: Skin is warm and dry.     Capillary Refill: Capillary refill takes less than 2 seconds.  Neurological:     General: No focal deficit present.     Mental Status: She is alert.     ED Results / Procedures / Treatments   Labs (all labs ordered are listed, but only abnormal results are displayed) Labs  Reviewed  COMPREHENSIVE METABOLIC PANEL - Abnormal; Notable for the following components:      Result Value   Glucose, Bld 111 (*)    All other components within normal limits  CBC WITH DIFFERENTIAL/PLATELET  MAGNESIUM  TSH  D-DIMER, QUANTITATIVE  TROPONIN I (HIGH SENSITIVITY)  TROPONIN I (HIGH SENSITIVITY)    EKG EKG Interpretation  Date/Time:  Wednesday Apr 01 2021 09:36:59 EDT Ventricular Rate:  101 PR Interval:  135 QRS Duration: 77 QT Interval:  326 QTC Calculation:  423 R Axis:   31 Text Interpretation: Sinus tachycardia Low voltage, precordial leads No significant change since prior 1/17 Confirmed by Aletta Edouard [phone]) on 04/01/2021 10:12:01 AM   Radiology DG Chest Port 1 View  Result Date: 04/01/2021 CLINICAL DATA:  Chest pain and cardiac arrhythmia EXAM: PORTABLE CHEST 1 VIEW COMPARISON:  September 03, 2006 FINDINGS: Lungs are clear. Heart is upper normal in size with pulmonary vascularity normal. No adenopathy. No pneumothorax. No bone lesions. IMPRESSION: Lungs clear.  Heart upper normal in size. Electronically Signed   By: Lowella Grip III M.D.   On: 04/01/2021 10:37    Procedures Procedures   Medications Ordered in ED Medications  aspirin chewable tablet 324 mg (324 mg Oral Given 04/01/21 1000)    ED Course  I have reviewed the triage vital signs and the nursing notes.  Pertinent labs & imaging results that were available during my care of the patient were reviewed by me and considered in my medical decision making (see chart for details).  Clinical Course as of 04/01/21 1646  Wed Apr 01, 2021  1018 Chest x-ray without any acute infiltrates interpreted by me.  Awaiting radiology input. [MB]    Clinical Course User Index [MB] Hayden Rasmussen, MD   MDM Rules/Calculators/A&P                         This patient complains of upper abdominal and lower chest pressure and shortness of breath; this involves an extensive number of treatment Options and is  a complaint that carries with it a high risk of complications and Morbidity. The differential includes GERD, peptic ulcer disease, ACS, pneumonia, pneumothorax, PE, vascular  I ordered, reviewed and interpreted labs, which included CBC with normal white count normal hemoglobin, chemistries normal, LFTs normal, troponins flat, D-dimer not elevated, TSH normal I ordered medication aspirin I ordered imaging studies which included chest x-ray and I independently    visualized and interpreted imaging which showed no acute findings Additional history obtained from patient's husband Previous records obtained and reviewed in epic, no recent cardiac work-ups  After the interventions stated above, I reevaluated the patient and found patient symptoms to be improving.  I reviewed her work-up with her and explained that there was no evidence that this was cardiac injury at this time.  She is comfortable with following up with her primary care doctor and GI specialist.  Return instructions discussed   Final Clinical Impression(s) / ED Diagnoses Final diagnoses:  Nonspecific chest pain  Palpitations    Rx / DC Orders ED Discharge Orders    None       Hayden Rasmussen, MD 04/01/21 [phone]

## 2022-06-15 IMAGING — DX DG CHEST 1V PORT
1 series · 1 of 1 positions shown · non-contrast
Comparison: September 03, 2006

CLINICAL DATA: Chest pain and cardiac arrhythmia

EXAM:
PORTABLE CHEST 1 VIEW

[chest]
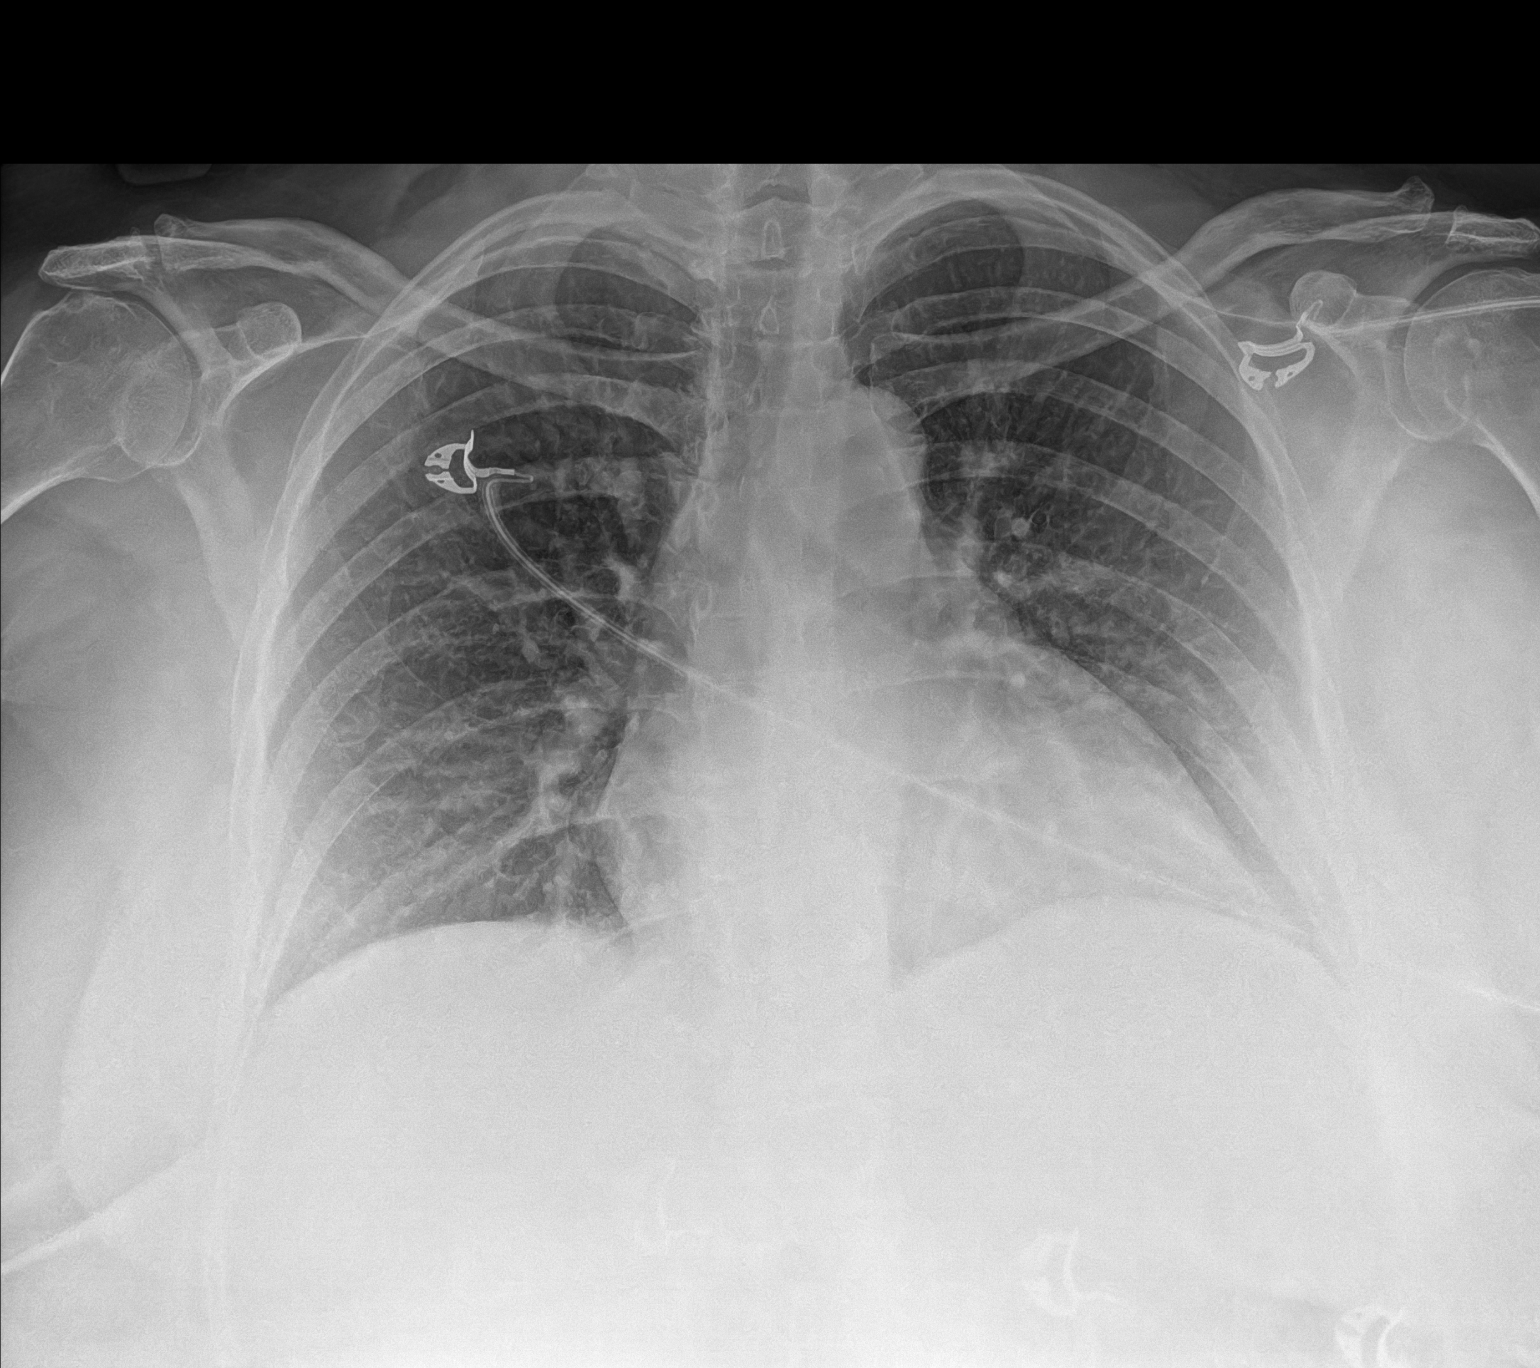

[1 of 1 positions shown; findings below may reference images not displayed]

FINDINGS: Lungs are clear. Heart is upper normal in size with pulmonary
vascularity normal. No adenopathy. No pneumothorax. No bone lesions.
IMPRESSION: Lungs clear.  Heart upper normal in size.

## 2022-09-28 ENCOUNTER — Other Ambulatory Visit: Payer: Self-pay | Admitting: Family Medicine

## 2022-09-28 DIAGNOSIS — M5416 Radiculopathy, lumbar region: Secondary | ICD-10-CM

## 2023-04-03 ENCOUNTER — Emergency Department (HOSPITAL_BASED_OUTPATIENT_CLINIC_OR_DEPARTMENT_OTHER)
Admission: EM | Admit: 2023-04-03 | Discharge: 2023-04-03 | Disposition: A | Discharge status: Home / Self Care | Payer: BC Managed Care – PPO | Attending: Emergency Medicine | Admitting: Emergency Medicine

## 2023-04-03 ENCOUNTER — Emergency Department (HOSPITAL_BASED_OUTPATIENT_CLINIC_OR_DEPARTMENT_OTHER): Payer: BC Managed Care – PPO

## 2023-04-03 ENCOUNTER — Other Ambulatory Visit: Payer: Self-pay

## 2023-04-03 ENCOUNTER — Encounter (HOSPITAL_BASED_OUTPATIENT_CLINIC_OR_DEPARTMENT_OTHER): Payer: Self-pay | Admitting: Emergency Medicine

## 2023-04-03 DIAGNOSIS — R0602 Shortness of breath: Secondary | ICD-10-CM | POA: Diagnosis present

## 2023-04-03 DIAGNOSIS — R000 Tachycardia, unspecified: Secondary | ICD-10-CM | POA: Diagnosis not present

## 2023-04-03 DIAGNOSIS — Z85828 Personal history of other malignant neoplasm of skin: Secondary | ICD-10-CM | POA: Insufficient documentation

## 2023-04-03 LAB — CBC WITH DIFFERENTIAL/PLATELET
Abs Immature Granulocytes: 0.02 10*3/uL (ref 0.00–0.07)
Basophils Absolute: 0.1 10*3/uL (ref 0.0–0.1)
Basophils Relative: 1 %
Eosinophils Absolute: 0.1 10*3/uL (ref 0.0–0.5)
Eosinophils Relative: 1 %
HCT: 43.7 % (ref 36.0–46.0)
Hemoglobin: 14.6 g/dL (ref 12.0–15.0)
Immature Granulocytes: 0 %
Lymphocytes Relative: 22 %
Lymphs Abs: 1.8 10*3/uL (ref 0.7–4.0)
MCH: 30.6 pg (ref 26.0–34.0)
MCHC: 33.4 g/dL (ref 30.0–36.0)
MCV: 91.6 fL (ref 80.0–100.0)
Monocytes Absolute: 0.7 10*3/uL (ref 0.1–1.0)
Monocytes Relative: 9 %
Neutro Abs: 5.5 10*3/uL (ref 1.7–7.7)
Neutrophils Relative %: 67 %
Platelets: 248 10*3/uL (ref 150–400)
RBC: 4.77 MIL/uL (ref 3.87–5.11)
RDW: 12.6 % (ref 11.5–15.5)
WBC: 8.1 10*3/uL (ref 4.0–10.5)
nRBC: 0 % (ref 0.0–0.2)

## 2023-04-03 LAB — URINALYSIS, ROUTINE W REFLEX MICROSCOPIC
Bacteria, UA: NONE SEEN
Bilirubin Urine: NEGATIVE
Glucose, UA: NEGATIVE mg/dL
Ketones, ur: NEGATIVE mg/dL
Leukocytes,Ua: NEGATIVE
Nitrite: NEGATIVE
Protein, ur: NEGATIVE mg/dL
Specific Gravity, Urine: 1.005 (ref 1.005–1.030)
pH: 6 (ref 5.0–8.0)

## 2023-04-03 LAB — COMPREHENSIVE METABOLIC PANEL
ALT: 31 U/L (ref 0–44)
AST: 22 U/L (ref 15–41)
Albumin: 4.4 g/dL (ref 3.5–5.0)
Alkaline Phosphatase: 68 U/L (ref 38–126)
Anion gap: 6 (ref 5–15)
BUN: 13 mg/dL (ref 8–23)
CO2: 28 mmol/L (ref 22–32)
Calcium: 9.6 mg/dL (ref 8.9–10.3)
Chloride: 104 mmol/L (ref 98–111)
Creatinine, Ser: 0.68 mg/dL (ref 0.44–1.00)
GFR, Estimated: >60 mL/min (ref >60)
Glucose, Bld: 113 mg/dL — ABNORMAL HIGH (ref 70–99)
Potassium: 3.6 mmol/L (ref 3.5–5.1)
Sodium: 138 mmol/L (ref 135–145)
Total Bilirubin: 0.6 mg/dL (ref 0.3–1.2)
Total Protein: 7.2 g/dL (ref 6.5–8.1)

## 2023-04-03 LAB — TROPONIN I (HIGH SENSITIVITY)
Troponin I (High Sensitivity): 2 ng/L (ref <18)
Troponin I (High Sensitivity): 3 ng/L (ref <18)

## 2023-04-03 LAB — D-DIMER, QUANTITATIVE: D-Dimer, Quant: 0.49 ug/mL-FEU (ref 0.00–0.50)

## 2023-04-03 LAB — LIPASE, BLOOD: Lipase: 35 U/L (ref 11–51)

## 2023-04-03 LAB — TSH: TSH: 0.358 u[IU]/mL (ref 0.350–4.500)

## 2023-04-03 MED ORDER — SODIUM CHLORIDE 0.9 % IV BOLUS
1000.0000 mL | Freq: Once | INTRAVENOUS | Status: AC
  Administered 2023-04-03: 1000 mL via INTRAVENOUS

## 2023-04-03 MED ORDER — PANTOPRAZOLE SODIUM 40 MG IV SOLR
40.0000 mg | Freq: Once | INTRAVENOUS | Status: AC
  Administered 2023-04-03: 40 mg via INTRAVENOUS
  Filled 2023-04-03: qty 10

## 2023-04-03 MED ORDER — IOHEXOL 300 MG/ML  SOLN
100.0000 mL | Freq: Once | INTRAMUSCULAR | Status: AC | PRN
  Administered 2023-04-03: 100 mL via INTRAVENOUS

## 2023-04-03 NOTE — Discharge Instructions (Signed)
Please follow-up with a primary care provider of your choosing or the 1 I have attached for you regarding recent symptoms and ER visit.  Today your labs and imaging were all reassuring and your physical exam was also reassuring.  Please monitor your symptoms and if symptoms worsen please return to ER.

## 2023-04-03 NOTE — ED Provider Notes (Signed)
[place] EMERGENCY DEPARTMENT AT [facility] Provider Note   CSN: [id number] Arrival date & time: 04/03/23  1713     History  Chief Complaint  Patient presents with   Tachycardia    Jody Walker is a 62 y.o. female, history of GERD, endometrial cancer, Lynch syndrome, who presents to the ED secondary to episode of dizziness/lightheadedness, associated with shortness of breath, racing heart, and some nausea that occurred about an hour ago.  She states that she was at a weight, when this occurred sitting down for 45 minute.  She states that one of the people at the way, checked her heart rate, and stated it was around 130 bpm.  She states that the shortness of breath improved, nausea resolved.  She denies any current abdominal pain, vomiting, chest pain.  Has never had this before, not on any oral contraceptives, hormone therapy, no history of any kind of cardiac issues, or blood clots.    Home Medications Prior to Admission medications   Medication Sig Start Date End Date Taking? Authorizing Provider  acetaminophen (TYLENOL) 500 MG tablet Take 500 mg by mouth every 6 (six) hours as needed.    [provider]  diazepam (VALIUM) 2 MG tablet TAKE 1 TABLET BY MOUTH EVERY 12 HOURS AS NEEDED FOR ANXIETY Patient not taking: Reported on 04/18/2020 01/30/18   Waldon Merl, PA-C  diclofenac (VOLTAREN) 75 MG EC tablet Take 75 mg by mouth 2 (two) times daily. 04/15/20   [provider]  DULoxetine (CYMBALTA) 20 MG capsule TAKE 1 CAPSULE BY MOUTH EVERY DAY 06/19/20   Waldon Merl, PA-C  fluticasone [facility]) 50 MCG/ACT nasal spray Place 2 sprays into the nose daily.    [provider]  Vitamin D, Ergocalciferol, (DRISDOL) 1.25 MG (50000 UNIT) CAPS capsule Take 1 capsule (50,000 Units total) by mouth every 7 (seven) days. 05/30/20   Waldon Merl, PA-C      Allergies    Hydrocodone-acetaminophen, Levofloxacin, Other, Keflex [cephalexin], and Lidocaine     Review of Systems   Review of Systems  Respiratory:  Positive for shortness of breath.   Cardiovascular:  Negative for chest pain.  Gastrointestinal:  Positive for nausea. Negative for vomiting.    Physical Exam Updated Vital Signs BP (!) 154/74   Pulse (!) 119   Temp 98 F (36.7 C) (Oral)   Resp 20   Wt 114.8 kg   SpO2 100%   BMI 46.27 kg/m  Physical Exam Vitals and nursing note reviewed.  Constitutional:      General: She is not in acute distress.    Appearance: She is well-developed.  HENT:     Head: Normocephalic and atraumatic.  Eyes:     Conjunctiva/sclera: Conjunctivae normal.  Cardiovascular:     Rate and Rhythm: Regular rhythm. Tachycardia present.     Heart sounds: No murmur heard. Pulmonary:     Effort: Pulmonary effort is normal. No respiratory distress.     Breath sounds: Normal breath sounds.  Abdominal:     Palpations: Abdomen is soft.     Tenderness: There is no abdominal tenderness.  Musculoskeletal:        General: No swelling.     Cervical back: Neck supple.  Skin:    General: Skin is warm and dry.     Capillary Refill: Capillary refill takes less than 2 seconds.  Neurological:     Mental Status: She is alert.  Psychiatric:  Mood and Affect: Mood normal.     ED Results / Procedures / Treatments   Labs (all labs ordered are listed, but only abnormal results are displayed) Labs Reviewed  COMPREHENSIVE METABOLIC PANEL - Abnormal; Notable for the following components:      Result Value   Glucose, Bld 113 (*)    All other components within normal limits  URINALYSIS, ROUTINE W REFLEX MICROSCOPIC - Abnormal; Notable for the following components:   Color, Urine COLORLESS (*)    Hgb urine dipstick Jody Walker (*)    All other components within normal limits  CBC WITH DIFFERENTIAL/PLATELET  D-DIMER, QUANTITATIVE  TSH  LIPASE, BLOOD  TROPONIN I (HIGH SENSITIVITY)    EKG EKG Interpretation  Date/Time:  Sunday Apr 03 2023 17:21:55  EDT Ventricular Rate:  114 PR Interval:  146 QRS Duration: 87 QT Interval:  316 QTC Calculation: 436 R Axis:   23 Text Interpretation: Sinus tachycardia Low voltage, precordial leads No significant change since last tracing Confirmed by Alvino Blood ([id number]) on 04/03/2023 5:24:07 PM  Radiology DG Chest Port 1 View  Result Date: 04/03/2023 CLINICAL DATA:  Shortness of breath. EXAM: PORTABLE CHEST 1 VIEW COMPARISON:  Chest radiograph dated 04/01/2021. FINDINGS: No focal consolidation, pleural effusion, or pneumothorax. Stable cardiac silhouette. No acute osseous pathology. IMPRESSION: No active disease. Electronically Signed   By: Elgie Collard M.D.   On: 04/03/2023 18:45    Procedures Procedures    Medications Ordered in ED Medications  sodium chloride 0.9 % bolus 1,000 mL (1,000 mLs Intravenous New Bag/Given 04/03/23 1851)  pantoprazole (PROTONIX) injection 40 mg (40 mg Intravenous Given 04/03/23 1848)    ED Course/ Medical Decision Making/ A&P                             Medical Decision Making Patient is a 62 year old female, here for episode of lightheadedness/dizziness, associated with shortness of breath, racing heart, and nausea that occurred about an hour ago, lasted about 45 minutes, and occurred while sitting.  States her heart rate went up to 130s beats per minute.  Now complaining of epigastric pain, shortness of breath has now resolved.  We will obtain dimer, troponins, chest x-ray, blood work, including lipase for further evaluation.  She is well-appearing, on exam.  Amount and/or Complexity of Data Reviewed Labs: ordered.    Details: Urinalysis unremarkable, no evidence of leukocytosis, troponin 2, d-dimer <0.5 Radiology: ordered.    Details: Chest x-ray clear ECG/medicine tests:  Decision-making details documented in ED Course.    Details: Sinus tachycardia Discussion of management or test interpretation with external provider(s): Discussed with attending, and  then Jiim PA, they will follow-up on results, disposition accordingly.  Low risk Wells--negative d-dimer, will obtain CT abd pelvis for further eval of epigastric pain, etiology of tachycardia. Pending CMP. She has no focal deficits on exam and is overall well-appearing, only complaining of epigastric pain which has been chronic, upon arrival to the ER, but previously arrived to the ER given the shortness of breath and dizzy episode with tachycardia.  She is well-appearing, has not had any recent illnesses.  Risk Prescription drug management.    Final Clinical Impression(s) / ED Diagnoses Final diagnoses:  Tachycardia    Rx / DC Orders ED Discharge Orders     None         Vora Clover, [place]ley [place], PA 04/03/23 1852    Lonell Grandchild, MD 04/03/23 2249

## 2023-04-03 NOTE — ED Provider Notes (Signed)
Patient given in sign out by [employer], PA-C.  Please review their note for patient HPI, physical exam, workup.  At this time the plan is to follow-up on CT scan for the etiology of patient's tachycardia and symptoms and dispo accordingly.  Patient's labs and CT scans were all normal. Spoke to the patient and performed physical exam which was unremarkable including no murmurs or rashes.  I spoke to the patient she states that her brother left the ICU today for having a stroke and patient was at a week today for another close friend would not she began having the high heart rate.  Patient states she does have history of panic attacks but states this felt very different.  After I explained to the patient that her labs and imaging were all reassuring patient states that she did feel better after receiving the fluid.  Patient was on Valium back in 2021 and asked if she is still taking benzos and patient stated that she has not taken them in years and denies any new medications.  At this time I have a low suspicion for any life-threatening diagnosis and patient may be discharged with outpatient follow-up.  Patient stable for discharge at this time.  Patient verbalized agreement to this plan.    Remi Deter 04/03/23 2104    Lonell Grandchild, MD 04/03/23 2249

## 2023-04-03 NOTE — ED Triage Notes (Signed)
Pt arrives pov, to triage in wheelchair with c/o tachycardia and hypertension acutely 1 hr pta. Pt also reports RT side HA. Endorses epigastric pain, denies CP. Pt reports being [facility] family member when symptoms occurred

## 2023-11-16 ENCOUNTER — Ambulatory Visit (INDEPENDENT_AMBULATORY_CARE_PROVIDER_SITE_OTHER): Payer: BC Managed Care – PPO

## 2023-11-16 ENCOUNTER — Ambulatory Visit: Payer: BC Managed Care – PPO | Admitting: Podiatry

## 2023-11-16 ENCOUNTER — Encounter: Payer: Self-pay | Admitting: Podiatry

## 2023-11-16 DIAGNOSIS — M2031 Hallux varus (acquired), right foot: Secondary | ICD-10-CM | POA: Diagnosis not present

## 2023-11-16 DIAGNOSIS — M2141 Flat foot [pes planus] (acquired), right foot: Secondary | ICD-10-CM | POA: Diagnosis not present

## 2023-11-16 DIAGNOSIS — M778 Other enthesopathies, not elsewhere classified: Secondary | ICD-10-CM | POA: Diagnosis not present

## 2023-11-16 NOTE — Progress Notes (Signed)
Chief Complaint  Patient presents with   Foot Pain    RM#7 Right foot pain X 1 year gotten worse in the last 2-3 months no injury.    HPI: 62 y.o. female presenting today as a new patient for evaluation of chronic pain and tenderness associated to the right foot ongoing for several years over a decade now.  It has gradually become worse over several years and it is affecting her quality of life on a daily basis.  Patient states that she has tried custom molded orthotics in the past as well as ankle bracing with no real alleviation of symptoms for her.  She takes OTC anti-inflammatory occasionally for pain.  She presents for further treatment evaluation   Past Medical History:  Diagnosis Date   Allergy    Anxiety    DDD (degenerative disc disease), lumbar    Depression    DJD (degenerative joint disease)    Left foot   Fibromyalgia    GERD (gastroesophageal reflux disease)    GIST, non-malignant    Herniated disc    History of nephrolithiasis    Hyperlipidemia    IBS (irritable bowel syndrome)    RLS (restless legs syndrome)    Sciatica    recurrent left    Past Surgical History:  Procedure Laterality Date   ABDOMINAL HYSTERECTOMY     CESAREAN SECTION     LEFT OOPHORECTOMY     MASTECTOMY Bilateral    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  Dermatology: Skin is warm, dry and supple bilateral lower extremities.   Vascular: Palpable pedal pulses bilaterally.  Capillary refill within normal limits.  No appreciable edema.  No erythema.  Neurological: Grossly intact via light touch  Musculoskeletal Exam: Severe pain and tenderness associated to the subtalar joint with limited inversion and eversion of the joint.  With motion of the subtalar joint there is significant pain and tenderness.  Also with weightbearing there is complete collapse of the medial longitudinal arch of the foot and rearfoot valgus deformity.  Hallux varus deformity also noted with medial deviation of the great toe as well as the lesser digits.  Thank you  Radiographic Exam RT foot and ankle 11/16/2023:  Normal osseous mineralization. Joint spaces preserved.  No fractures or osseous irregularities noted.  Assessment/Plan of Care: 1.  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  X-rays reviewed in detail with the patient today -The patient has now had chronic severe pain and tenderness associated to the right foot for several years.  She has tried custom orthotics in the past as well as ankle bracing and shoe gear modifications with anti-inflammatory and there is no alleviation of symptoms.  Patient states that she has neglected her feet for years and she is ready to have it  treated. -Conservatively I do feel that the only options would be orthotics to help support the medial longitudinal arch of the foot as well as ankle bracing and shoe gear.  Also cortisone injections and anti-inflammatories however these modalities would not correct for her flatfoot deformity but simply help temporarily alleviate it.  She has tried custom orthotics in the past with ankle bracing and there is been no improvement.  I do believe it is warranted at this time to discuss surgery which would include flatfoot reconstruction essentially triple arthrodesis and hallux first MTP arthrodesis to correct  for the symptomatic hallux varus deformity -Risk benefits advantages and disadvantages as well as the postoperative recovery course were explained in great detail in length to the patient. Postoperative recovery course was also discussed in detail.  Patient understands that she will be strictly nonweightbearing for approximately 8-12 weeks using a knee scooter.  Patient is retired and says that she has help around the house.  She is in a position to care for herself postoperatively and ensure adequate healing postoperatively.  Patient is ready proceed with surgery and consents for surgical correction at this time -Authorization for surgery was initiated today.  Surgery will consist of triple arthrodesis right.  Tendo Achilles lengthening right.  First metatarsophalangeal joint arthrodesis right. -Order also placed for CT scan of the right foot.  Prior to surgery I would like to have the CT scan completed to better evaluate the osseous structures of the foot although I do not believe it would change the surgical procedures it will assist with surgical planning -Return to clinic 1 week postop  *Retired RN     Felecia Shelling, DPM [facility]  Dr. Felecia Shelling, DPM    [address] [place], [place] [address]                Office [phone]  Fax [phone]

## 2023-11-25 ENCOUNTER — Ambulatory Visit
Admission: RE | Admit: 2023-11-25 | Discharge: 2023-11-25 | Disposition: A | Discharge status: Home / Self Care | Payer: BC Managed Care – PPO | Source: Ambulatory Visit | Attending: Podiatry | Admitting: Podiatry

## 2023-11-25 DIAGNOSIS — M2141 Flat foot [pes planus] (acquired), right foot: Secondary | ICD-10-CM

## 2023-12-07 ENCOUNTER — Telehealth: Payer: Self-pay | Admitting: Podiatry

## 2023-12-07 NOTE — Telephone Encounter (Signed)
 DOS-12/29/23  TRIPLE ARTHRODESIS [id number] HALLUX MPJ FUSION [id number]  [employer] EFFECTIVE DATE- 11/30/23  DEDUCTIBLE- $1500.00 WITH REMAINING $1500.00 OOP-$5900.00 WITH REMAINING $5900.00 COINSURANCE- 30%  SPOKE WITH JOHN F FROM [employer] AND HE STATED THAT PRIOR AUTH IS NOT REQUIRED FOR CPT CODES [id number] AND [phone].  CALL REF #: [id number]

## 2023-12-13 ENCOUNTER — Telehealth: Payer: Self-pay | Admitting: Podiatry

## 2023-12-13 NOTE — Telephone Encounter (Signed)
 Spoke with patient and reviewed CT results.  Scheduled for triple arthrodesis with tendo Achilles lengthening surgery on 12/29/2023.  Additional questions were answered.  She does have access to a electric wheelchair and she is planning to use that after surgery.  She also had the knee scooter dispensed at her home but it does cause some knee pain when applying pressure.  No change on surgical planning based on CT scan results.  Return to clinic 1 week postop

## 2023-12-29 ENCOUNTER — Other Ambulatory Visit: Payer: Self-pay | Admitting: Podiatry

## 2023-12-29 DIAGNOSIS — M2141 Flat foot [pes planus] (acquired), right foot: Secondary | ICD-10-CM | POA: Diagnosis not present

## 2023-12-29 DIAGNOSIS — M2031 Hallux varus (acquired), right foot: Secondary | ICD-10-CM | POA: Diagnosis not present

## 2023-12-29 MED ORDER — IBUPROFEN 800 MG PO TABS: 800.0000 mg | ORAL_TABLET | Freq: Three times a day (TID) | ORAL | 1 refills | Status: AC

## 2023-12-29 MED ORDER — OXYCODONE-ACETAMINOPHEN 10-325 MG PO TABS: 1.0000 | ORAL_TABLET | ORAL | 0 refills | Status: AC | PRN

## 2023-12-29 NOTE — Progress Notes (Signed)
PRN postop

## 2024-01-04 ENCOUNTER — Encounter: Payer: Self-pay | Admitting: Podiatry

## 2024-01-04 ENCOUNTER — Ambulatory Visit (INDEPENDENT_AMBULATORY_CARE_PROVIDER_SITE_OTHER): Payer: 59 | Admitting: Podiatry

## 2024-01-04 ENCOUNTER — Ambulatory Visit (INDEPENDENT_AMBULATORY_CARE_PROVIDER_SITE_OTHER): Payer: 59

## 2024-01-04 VITALS — Ht 62.0 in | Wt 253.0 lb

## 2024-01-04 DIAGNOSIS — M2141 Flat foot [pes planus] (acquired), right foot: Secondary | ICD-10-CM | POA: Diagnosis not present

## 2024-01-04 MED ORDER — DOXYCYCLINE HYCLATE 100 MG PO TABS
100.0000 mg | ORAL_TABLET | Freq: Two times a day (BID) | ORAL | 0 refills | Status: DC
Start: 2024-01-04 — End: 2024-01-06

## 2024-01-04 NOTE — Progress Notes (Signed)
   Chief Complaint  Patient presents with   Routine Post Op    Pt is here for 1st post op visit since surgery, pt states her foot is ok still having some pain but she know that is excepted.    Subjective:  Patient presents today status post right foot triple arthrodesis with first MTP arthrodesis.  DOS: 12/29/2023.  Patient doing well.  She is nonweightbearing as instructed.  Dressings are clean dry and intact.  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  Incision well coapted with sutures intact.  JP drain intact.  There is some mild to moderate erythema around the foot diffusely with edema.  Concerning for likely low-grade cellulitis postoperatively.  Scant maceration noted along the lateral incision site as well  Radiographic Exam RT foot and ankle 01/04/2024:  Orthopedic hardware is stable and intact.  Arthrodesis site stable.  No gas within the  tissues.  Assessment: 1. s/p triple arthrodesis + first MTP arthrodesis RT. DOS: 12/29/2023   Plan of Care:  -Patient was evaluated. X-rays reviewed -JP drain removed - Dressings changed.  Betadine wet to dry.  Leave clean dry and intact x 1 week -Due to the diffuse erythema around the foot I did call in a prescription for doxycycline  100 mg twice daily x 10 days -Continue strict NWB in the cam boot -Return to clinic 1 week   Thresa EMERSON Sar, DPM [facility]  Dr. Thresa EMERSON Sar, DPM    [address] [place], [place] [address]                Office [phone]  Fax [phone]

## 2024-01-05 ENCOUNTER — Telehealth: Payer: Self-pay

## 2024-01-05 NOTE — Telephone Encounter (Signed)
 Patient called and left a message - she cannot take doxycycline , had a bad reaction last time she tried it - she can take amoxicillin  or augmentin  with no trouble.Please change anitibiotic

## 2024-01-06 ENCOUNTER — Other Ambulatory Visit: Payer: Self-pay | Admitting: Podiatry

## 2024-01-06 MED ORDER — AMOXICILLIN-POT CLAVULANATE 875-125 MG PO TABS: 1.0000 | ORAL_TABLET | Freq: Two times a day (BID) | ORAL | 0 refills | Status: DC

## 2024-01-06 NOTE — Progress Notes (Signed)
 Patient allergic to doxycycline .  Discontinue.  Prescription for Augmentin  875/125 mg.  Known to tolerate.

## 2024-01-06 NOTE — Telephone Encounter (Signed)
 Patient was informed.

## 2024-01-06 NOTE — Telephone Encounter (Signed)
 Pt called and left message yesterday about having a allergic reaction to the doxycyline. She stated she has taken Augmentin  and did ok with that. She is asking if you could send that the pharmacy for her. She just wants to get it before the weekend. Pharmacy correct in chart.

## 2024-01-11 ENCOUNTER — Ambulatory Visit (INDEPENDENT_AMBULATORY_CARE_PROVIDER_SITE_OTHER): Payer: 59 | Admitting: Podiatry

## 2024-01-11 ENCOUNTER — Encounter: Payer: Self-pay | Admitting: Podiatry

## 2024-01-11 DIAGNOSIS — M2141 Flat foot [pes planus] (acquired), right foot: Secondary | ICD-10-CM

## 2024-01-11 NOTE — Progress Notes (Signed)
   Chief Complaint  Patient presents with   Post-op Problem    RM# 8 POV #2, DOS 12/29/23, TRIPLE ARTHRODESIS RIGHT FOOT, RIGHT GREAT TOE ARTHRODESIS- Patient states doing well having significant pain and swelling.    Subjective:  Patient presents today status post right foot triple arthrodesis with first MTP arthrodesis.  DOS: 12/29/2023.  Patient doing well.  She only takes 1 oxycodone in the evenings for back.  She has been nonweightbearing to the extremity and doing well.  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  Incision well coapted with sutures intact with exception of some continued erythema and what appears to be some superficial skin breakdown and friability to the lateral incision site.  No drainage.  Overall appears very stable and the staples are intact.  Radiographic Exam RT foot and ankle 01/04/2024:  Orthopedic hardware is stable and  intact.  Arthrodesis site stable.  No gas within the tissues.  Assessment: 1. s/p triple arthrodesis + first MTP arthrodesis RT. DOS: 12/29/2023   Plan of Care:  -Patient was evaluated.  -Sutures and staples were removed from the surgical extremity with exception to the lateral incision site which continues to heal -Dressings were changed.  Leave clean dry and intact x 1 week -Continue Augmentin 875/1.5 mg until completed -Continue strict NWB surgical extremity using a knee scooter in the cam boot -Return to clinic 1 week   Felecia Shelling, DPM [facility]  Dr. Felecia Shelling, DPM    [address] [place], [place] [address]                Office [phone]  Fax [phone]

## 2024-01-18 ENCOUNTER — Ambulatory Visit (INDEPENDENT_AMBULATORY_CARE_PROVIDER_SITE_OTHER): Payer: 59 | Admitting: Podiatry

## 2024-01-18 ENCOUNTER — Ambulatory Visit (INDEPENDENT_AMBULATORY_CARE_PROVIDER_SITE_OTHER): Payer: 59

## 2024-01-18 ENCOUNTER — Encounter: Payer: Self-pay | Admitting: Podiatry

## 2024-01-18 DIAGNOSIS — L03119 Cellulitis of unspecified part of limb: Secondary | ICD-10-CM

## 2024-01-18 DIAGNOSIS — M2141 Flat foot [pes planus] (acquired), right foot: Secondary | ICD-10-CM

## 2024-01-18 DIAGNOSIS — L02619 Cutaneous abscess of unspecified foot: Secondary | ICD-10-CM

## 2024-01-18 DIAGNOSIS — M2031 Hallux varus (acquired), right foot: Secondary | ICD-10-CM

## 2024-01-18 DIAGNOSIS — Z9889 Other specified postprocedural states: Secondary | ICD-10-CM

## 2024-01-18 NOTE — Progress Notes (Signed)
Chief Complaint  Patient presents with   Routine Post Op    POV #3, DOS 12/29/23, TRIPLE ARTHRODESIS RIGHT FOOT, RIGHT GREAT TOE ARTHRODESIS   "Its still a little sore. It keeps draining on that one side"    Subjective:  Patient presents today status post right foot triple arthrodesis with first MTP arthrodesis.  DOS: 12/29/2023.  Patient is doing very well.  She believes that the cam boot is irritating/rubbing the lateral incision site of the foot.  Other than that she states that she is doing well and the dressings have been clean dry and intact for the last week she is nonweightbearing in the cam boot without any issues  Past Medical History:  Diagnosis Date   Allergy    Anxiety    DDD (degenerative disc disease), lumbar    Depression    DJD (degenerative joint disease)    Left foot   Fibromyalgia    GERD (gastroesophageal reflux disease)    GIST, non-malignant    Herniated disc    History of nephrolithiasis    Hyperlipidemia    IBS (irritable bowel syndrome)    RLS (restless legs syndrome)    Sciatica    recurrent left    Past Surgical History:  Procedure Laterality Date   ABDOMINAL HYSTERECTOMY     CESAREAN SECTION     LEFT OOPHORECTOMY     MASTECTOMY Bilateral    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  All incisions are nicely healed with exception of the lateral incision site.  There is some dehiscence along the central portion of the incision site.  Scant serous drainage noted.  No  purulence or erythema around the incision.  Clinically it does not appear to be infected.  Radiographic Exam RT foot and ankle 01/18/2024:  Unchanged with routine healing.  Orthopedic hardware is stable and intact.  Arthrodesis site stable.    Assessment: 1. s/p triple arthrodesis + first MTP arthrodesis RT. DOS: 12/29/2023 2.  Dehiscence along the lateral incision site right foot possibly secondary to irritation from the cam boot  Plan of Care:  -Patient was evaluated.  -Sutures and staples were removed from the surgical extremity with exception to the lateral incision site which continues to heal -Dressings were changed.  Leave clean dry and intact x 1 week - Patient has completed a course of Augmentin 875/125 mg x 10 days -Continue strict NWB surgical extremity using a knee scooter in the cam boot.  Patient may remove the cam boot when she is sitting in her recliner or sedentary. -Also a night splint was dispensed to protect the foot when she sleeps at night.  She says at night she rolls and tosses and turns and she believes that the cam boot presses and irritates the lateral incision site -Return to clinic 1 week   Felecia Shelling, DPM [facility]  Dr. Felecia Shelling, DPM  [address] [place], [place] [address]                Office [phone]  Fax [phone]

## 2024-01-22 LAB — WOUND CULTURE
MICRO NUMBER:: 16103853
SPECIMEN QUALITY:: ADEQUATE

## 2024-01-23 ENCOUNTER — Telehealth: Payer: Self-pay | Admitting: Podiatry

## 2024-01-23 NOTE — Telephone Encounter (Signed)
 Pt. saw in MyChart results that she does have an infection in R foot wound. Wants to know if Dr. Logan Bores wants to do anything before coming to appt sch'ed for Wed.

## 2024-01-24 ENCOUNTER — Other Ambulatory Visit: Payer: Self-pay | Admitting: Podiatry

## 2024-01-24 MED ORDER — CIPROFLOXACIN HCL 500 MG PO TABS: 500.0000 mg | ORAL_TABLET | Freq: Two times a day (BID) | ORAL | 0 refills | Status: AC

## 2024-01-24 MED ORDER — GENTAMICIN SULFATE 0.1 % EX CREA: 1.0000 | TOPICAL_CREAM | Freq: Two times a day (BID) | CUTANEOUS | 1 refills | Status: AC

## 2024-01-24 NOTE — Progress Notes (Signed)
 Rx Cipro twice daily x 10 days based on Cultures taken.   F/u next scheduled appt.   Felecia Shelling, DPM [facility]  Dr. Felecia Shelling, DPM    [address] [place], [place] [address]                Office [phone]  Fax [phone]

## 2024-01-25 ENCOUNTER — Ambulatory Visit (INDEPENDENT_AMBULATORY_CARE_PROVIDER_SITE_OTHER): Payer: 59 | Admitting: Podiatry

## 2024-01-25 ENCOUNTER — Encounter: Payer: Self-pay | Admitting: Podiatry

## 2024-01-25 DIAGNOSIS — Z9889 Other specified postprocedural states: Secondary | ICD-10-CM

## 2024-01-25 NOTE — Progress Notes (Signed)
 Chief Complaint  Patient presents with   Routine Post Op    Post op Visit #4 possible scab between great and 2nd digit right foot. No complaints.    Subjective:  Patient presents today status post right foot triple arthrodesis with first MTP arthrodesis.  DOS: 12/29/2023.  Patient continues to do well.  NWB in the cam boot as instructed.  She takes the cam boot off when she is resting so is not to apply pressure to the lateral aspect of the foot which demonstrated some dehiscence last visit.  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  All incisions are nicely healed with exception of the lateral incision site.  Significant improvement noted to the lateral incision site.  There is some dehiscence along the central portion of the incision site.  No serous drainage.  No purulence or erythema around the incision.  Radiographic Exam RT foot and ankle  01/18/2024:  Unchanged with routine healing.  Orthopedic hardware is stable and intact.  Arthrodesis site stable.    Assessment: 1. s/p triple arthrodesis + first MTP arthrodesis RT. DOS: 12/29/2023 2.  Dehiscence along the lateral incision site right foot possibly secondary to irritation from the cam boot  Plan of Care:  -Patient was evaluated.  - Yesterday prescription for ciprofloxacin 500 mg twice daily x 10 days was sent to the pharmacy based on cultures and sensitivities taken last visit -Continue strict NWB surgical extremity using a knee scooter in the cam boot.  Patient may remove the cam boot when she is sitting in her recliner or sedentary. - Continue night splint to protect the foot when she sleeps at night.  She says at night she rolls and tosses and turns and she believes that the cam boot presses and irritates the lateral incision site -Return to clinic 1 week   Felecia Shelling, DPM [facility]  Dr. Felecia Shelling, DPM    [address], [place] [address]  Office [phone]  Fax [phone]

## 2024-02-01 ENCOUNTER — Encounter: Payer: Self-pay | Admitting: Podiatry

## 2024-02-01 ENCOUNTER — Ambulatory Visit (INDEPENDENT_AMBULATORY_CARE_PROVIDER_SITE_OTHER): Payer: 59 | Admitting: Podiatry

## 2024-02-01 DIAGNOSIS — Z9889 Other specified postprocedural states: Secondary | ICD-10-CM

## 2024-02-01 NOTE — Progress Notes (Signed)
 Chief Complaint  Patient presents with   Routine Post Op    Patient states she has been doing dressing changes and it seems to be getting better, some pain here and there but nothing major.     Subjective:  Patient presents today status post right foot triple arthrodesis with first MTP arthrodesis.  DOS: 12/29/2023.  Patient continues to do well.  NWB in the cam boot as instructed.  She has been applying gentamicin cream as well as Hydrofera Blue to the dehisced portion of the lateral aspect of the foot.  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  All incisions are nicely healed with exception of the lateral incision site.  Ulcer noted to the central portion of the incision site at the lateral aspect of the foot measuring approximately 3.5 x 1.5 x 0.3 cm.  Please see above noted photo.  Intermixed granular and  fibrotic tissue noted.  There is no exposed hardware or bone.  No malodor.  Scant drainage.  Radiographic Exam RT foot and ankle 01/18/2024:  Unchanged with routine healing.  Orthopedic hardware is stable and intact.  Arthrodesis site stable.    Assessment: 1. s/p triple arthrodesis + first MTP arthrodesis RT. DOS: 12/29/2023 2.  Dehiscence along the lateral incision site right foot possibly secondary to irritation from the cam boot  Plan of Care:  -Patient was evaluated.  -Light debridement of the wound was performed today using a tissue nipper.  Excisional debridement of the necrotic nonviable tissue down to healthier bleeding viable tissue was performed with postdebridement measurement centimeters.. - Complete the ciprofloxacin 500 mg twice daily x 10 days as prescribed.  This was prescribed on 01/24/2024 -Discontinue gentamicin cream for now. -Apply Hydrofera Blue and a light dressing daily or every other day.  Supplies provided -Continue strict NWB CAM boot -Return to clinic 2 weeks follow-up x-ray   Felecia Shelling, DPM [facility]  Dr. Felecia Shelling, DPM    [address]  [place], [place] [address]                Office [phone]  Fax [phone]

## 2024-02-15 ENCOUNTER — Ambulatory Visit (INDEPENDENT_AMBULATORY_CARE_PROVIDER_SITE_OTHER): Admitting: Podiatry

## 2024-02-15 ENCOUNTER — Encounter: Payer: Self-pay | Admitting: Podiatry

## 2024-02-15 ENCOUNTER — Ambulatory Visit (INDEPENDENT_AMBULATORY_CARE_PROVIDER_SITE_OTHER)

## 2024-02-15 VITALS — Ht 62.0 in | Wt 253.0 lb

## 2024-02-15 DIAGNOSIS — Z9889 Other specified postprocedural states: Secondary | ICD-10-CM

## 2024-02-15 NOTE — Progress Notes (Signed)
 Chief Complaint  Patient presents with   Routine Post Op    POV #6` DOS 12/29/23, TRIPLE ARTHRODESIS RIGHT FOOT, RIGHT GREAT TOE ARTHRODESIS, pt states her foot is fine and has no complaints.    Subjective:  Patient presents today status post right foot triple arthrodesis with first MTP arthrodesis.  DOS: 12/29/2023.  Patient continues to do well.  NWB in the cam boot as instructed.  She has been applying Hydrofera Blue to the ulcer.  She has noted significant improvement since last visit.  She has been applying dressing changes every other day  Past Medical History:  Diagnosis Date   Allergy    Anxiety    DDD (degenerative disc disease), lumbar    Depression    DJD (degenerative joint disease)    Left foot   Fibromyalgia    GERD (gastroesophageal reflux disease)    GIST, non-malignant    Herniated disc    History of nephrolithiasis    Hyperlipidemia    IBS (irritable bowel syndrome)    RLS (restless legs syndrome)    Sciatica    recurrent left    Past Surgical History:  Procedure Laterality Date   ABDOMINAL HYSTERECTOMY     CESAREAN SECTION     LEFT OOPHORECTOMY     MASTECTOMY Bilateral    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  Significant improvement of the ulcer noted to the lateral aspect of the foot.  Healthy granulation tissue noted within the wound base.  No drainage.  No erythema around the wound.  There is no exposed hardware.  Significant reduction of the fibrotic  tissue within the wound base  Radiographic Exam RT foot and ankle 02/15/2024:  Unchanged with routine healing.  Orthopedic hardware is stable and intact.  Arthrodesis site stable.    Assessment: 1. s/p triple arthrodesis + first MTP arthrodesis RT. DOS: 12/29/2023 2.  Dehiscence along the lateral incision site right foot possibly secondary to irritation from the cam boot  Plan of Care:  -Patient was evaluated.  - Excisional debridement of the wound was performed today using a tissue nipper.  Excisional debridement of the necrotic nonviable tissue down to healthier bleeding viable tissue was performed with postdebridement measurement centimeters.. - Patient has completed ciprofloxacin 500 mg twice daily x 10 days as prescribed.  This was prescribed on 01/24/2024 - Prisma collagen with overlying Hydrofera Blue was applied with dry dressing.  Continue dressing changes every other day at home -Prisma collagen and Hydrofera Blue provided -Continue strict NWB CAM boot -Return to clinic 3 weeks follow-up x-ray.  At this time she may be able to initiate weightbearing in the cam boot   Felecia Shelling, DPM [facility]  Dr. Felecia Shelling, DPM    [address]  [place], [place] [address]                Office [phone]  Fax [phone]

## 2024-03-07 ENCOUNTER — Ambulatory Visit (INDEPENDENT_AMBULATORY_CARE_PROVIDER_SITE_OTHER): Admitting: Podiatry

## 2024-03-07 ENCOUNTER — Ambulatory Visit (INDEPENDENT_AMBULATORY_CARE_PROVIDER_SITE_OTHER)

## 2024-03-07 ENCOUNTER — Encounter: Payer: Self-pay | Admitting: Podiatry

## 2024-03-07 DIAGNOSIS — Z9889 Other specified postprocedural states: Secondary | ICD-10-CM

## 2024-03-07 NOTE — Progress Notes (Signed)
 Chief Complaint  Patient presents with   Routine Post Op    RM#7 pov #4 patient states is doing well drainage from right foot wound no redness.   Subjective:  Patient presents today status post right foot triple arthrodesis with first MTP arthrodesis.  DOS: 12/29/2023.  Patient continues to do well.  NWB in the cam boot as instructed.  She has been applying Hydrofera Blue to the ulcer.  She has noted significant improvement since last visit.  She has been applying dressing changes every other day  Past Medical History:  Diagnosis Date   Allergy    Anxiety    DDD (degenerative disc disease), lumbar    Depression    DJD (degenerative joint disease)    Left foot   Fibromyalgia    GERD (gastroesophageal reflux disease)    GIST, non-malignant    Herniated disc    History of nephrolithiasis    Hyperlipidemia    IBS (irritable bowel syndrome)    RLS (restless legs syndrome)    Sciatica    recurrent left    Past Surgical History:  Procedure Laterality Date   ABDOMINAL HYSTERECTOMY     CESAREAN SECTION     LEFT OOPHORECTOMY     MASTECTOMY Bilateral    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  Continued significant improvement of the ulcer noted to the lateral aspect of the foot.  Healthy granulation tissue noted within the wound base with intermixed fibrotic debris.  No drainage or malodor.  No erythema around the wound.  There is no exposed hardware or bone.     Radiographic Exam RT foot and ankle 03/07/2024:  Unchanged with routine healing.  Orthopedic hardware is stable and intact.  Arthrodesis site stable.    Assessment: 1. s/p triple arthrodesis + first MTP arthrodesis RT. DOS: 12/29/2023 2.  Ulcer lateral aspect of the right foot with improvement  Plan of Care:  -Patient was evaluated.  - Excisional debridement of the wound was performed today using a tissue nipper.  Excisional debridement of the necrotic nonviable tissue down to healthier bleeding viable tissue was performed with postdebridement measurement centimeters.. - Patient has completed ciprofloxacin 500 mg twice daily x 10 days as prescribed.  This was prescribed on 01/24/2024 - Continue Prisma collagen with overlying Hydrofera Blue was applied with dry dressing.  Continue dressing changes every other day at home -Prisma collagen and Hydrofera Blue provided - Patient may now begin weightbearing as tolerated in the cam boot - Return to clinic 4 weeks for follow-up x-rays and to possibly initiate physical therapy   Felecia Shelling, DPM [facility]  Dr. Felecia Shelling, DPM    [address]  [place], [place] [address]                Office [phone]  Fax [phone]

## 2024-04-04 ENCOUNTER — Encounter: Payer: Self-pay | Admitting: Podiatry

## 2024-04-04 ENCOUNTER — Ambulatory Visit (INDEPENDENT_AMBULATORY_CARE_PROVIDER_SITE_OTHER): Admitting: Podiatry

## 2024-04-04 ENCOUNTER — Ambulatory Visit (INDEPENDENT_AMBULATORY_CARE_PROVIDER_SITE_OTHER)

## 2024-04-04 VITALS — Ht 62.0 in | Wt 253.0 lb

## 2024-04-04 DIAGNOSIS — Z9889 Other specified postprocedural states: Secondary | ICD-10-CM | POA: Diagnosis not present

## 2024-04-04 DIAGNOSIS — M19071 Primary osteoarthritis, right ankle and foot: Secondary | ICD-10-CM

## 2024-04-06 NOTE — Progress Notes (Signed)
   Chief Complaint  Patient presents with   Routine Post Op    RM9: DOS: 12/29/23, TRIPLE ARTHRODESIS RIGHT FOOT, RIGHT GREAT TOE ARTHRODESIS states she is doing well some minor heel pain when walking   Subjective:  Patient presents today status post right foot triple arthrodesis with first MTP arthrodesis.  DOS: 12/29/2023.  Doing very well.  She is WBAT in the cam boot as instructed.  She has been applying collagen dressing to the wound to the lateral aspect of the right foot.  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  Ulcer to the lateral aspect of the foot/wound dehiscence site has healed and resolved completely.  Complete reepithelialization is occurred.  No drainage.  No clinical indication of infection.  Radiographic Exam RT foot and ankle 04/06/2024:  Unchanged with routine healing.   Orthopedic hardware is stable and intact.  Arthrodesis site stable.    Assessment: 1. s/p triple arthrodesis + first MTP arthrodesis RT. DOS: 12/29/2023 2.  Ulcer lateral aspect of the right foot with improvement  Plan of Care:  -Patient was evaluated.  - The ulcer to the lateral aspect of the foot/wound dehiscence is healed.  Complete reepithelialization is occurred.  Doing well -The patient may now begin to transition out of the immobilization cam boot into good supportive tennis shoes and sneakers -Ultimately declined physical therapy.  Recommend slowly increase walking daily and range of motion of the foot -Return to clinic 6 weeks follow-up x-ray   Dot Gazella, DPM [facility]  Dr. Dot Gazella, DPM    [address] [place], [place] [address]                Office [phone]  Fax [phone]

## 2024-04-30 ENCOUNTER — Telehealth: Payer: Self-pay | Admitting: Podiatry

## 2024-04-30 NOTE — Telephone Encounter (Signed)
 Pt has been doing fine but as of last Friday the wound has opened slightly and she can barely walk. Should she come before her scheduled appointment on 5/4 ? If not, What steps should she take at home until appointment?

## 2024-05-02 ENCOUNTER — Encounter: Payer: Self-pay | Admitting: Podiatry

## 2024-05-02 ENCOUNTER — Ambulatory Visit (INDEPENDENT_AMBULATORY_CARE_PROVIDER_SITE_OTHER): Admitting: Podiatry

## 2024-05-02 ENCOUNTER — Ambulatory Visit (INDEPENDENT_AMBULATORY_CARE_PROVIDER_SITE_OTHER)

## 2024-05-02 VITALS — Ht 62.0 in | Wt 253.0 lb

## 2024-05-02 DIAGNOSIS — M19071 Primary osteoarthritis, right ankle and foot: Secondary | ICD-10-CM | POA: Diagnosis not present

## 2024-05-02 NOTE — Progress Notes (Signed)
   Chief Complaint  Patient presents with   Routine Post Op    Patient presents today status post right foot triple arthrodesis with first MTP arthrodesis.  DOS: 12/29/2023 pt states her foot is not doing well this pass Friday pain came back tremendously, unable to bear weight on it, can't walk like normal, pt states she has not did anything out of the normal, no new injury to foot.    Subjective:  Patient presents today status post right foot triple arthrodesis with first MTP arthrodesis.  DOS: 12/29/2023.  Patient states that she was doing well until this past Friday when she began to notice increase of pain and she began to be concerned no new activity or injury.  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  All incisions are nicely healed.  There is no edema or erythema to the foot.  Clinically no concern for infection. No crepitus with palpation. Stability  of the rearfoot noted.   Radiographic Exam RT foot and ankle 05/02/2024:  Unchanged with routine healing.  Orthopedic hardware is stable and intact.  Arthrodesis site stable.    Assessment: 1. s/p triple arthrodesis + first MTP arthrodesis RT. DOS: 12/29/2023  Plan of Care:  -Patient was evaluated.  - For now recommend conservative treatment.  RICE -Reduce activity -Continue wearing a supportive tennis shoes and sneakers.  If she continues to have pain recommend cam boot x 1 week to allow the foot to rest -Return to clinic point  Dot Gazella, DPM [facility]  Dr. Dot Gazella, DPM    [address] [place], [place] [address]                Office [phone]  Fax [phone]

## 2024-05-15 ENCOUNTER — Telehealth: Payer: Self-pay | Admitting: Podiatry

## 2024-05-15 NOTE — Telephone Encounter (Signed)
 Patient came in to the office because she wanted to ask you if there is something that she can use to cradle her heel. She states she is doing better but she can not stand on it for to long,

## 2024-05-30 ENCOUNTER — Ambulatory Visit (INDEPENDENT_AMBULATORY_CARE_PROVIDER_SITE_OTHER): Admitting: Podiatry

## 2024-05-30 DIAGNOSIS — M19071 Primary osteoarthritis, right ankle and foot: Secondary | ICD-10-CM

## 2024-05-30 NOTE — Progress Notes (Signed)
   Chief Complaint  Patient presents with   Routine Post Op    POV.3 DOS: 12/29/23, TRIPLE ARTHRODESIS RIGHT FOOT, RIGHT GREAT TOE ARTHRODESIS, pt states she is still having some pain to the foot especially in her heel when she walks feels pressure in the heel, still using scooter to ambulate at times.   Subjective:  Patient presents today for follow-up evaluation of right foot pain.  Overall modest improvement but she continues to have some persistent pain especially to the plantar heel.  Brief history: triple arthrodesis with first MTP arthrodesis.  DOS: 12/29/2023.  Patient states that she was doing well until 05/25/2024, Friday, when she began to notice increase of pain to the foot.  No history of injury.  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  All incisions are nicely healed.  There is no edema or erythema to the foot.  No concern for infection.  No  crepitus with palpation.  Stability of the right foot noted.  There is no tenderness or pain to the surgical sites  Radiographic Exam RT foot and ankle 05/02/2024:  Unchanged with routine healing.  Orthopedic hardware is stable and intact.  Arthrodesis site stable.    Assessment: 1. s/p triple arthrodesis + first MTP arthrodesis RT. DOS: 12/29/2023  Plan of Care:  -Patient was evaluated.  - Continue conservative treatment.  RICE PRN -Reduce activity -Continue wearing a supportive tennis shoes and sneakers.  Will plan to observe for now -Return to clinic 6 weeks follow-up x-ray  Thresa EMERSON Sar, DPM [facility]  Dr. Thresa EMERSON Sar, DPM    [address] [place], [place] [address]                Office [phone]  Fax [phone]

## 2024-07-11 ENCOUNTER — Ambulatory Visit (INDEPENDENT_AMBULATORY_CARE_PROVIDER_SITE_OTHER): Admitting: Podiatry

## 2024-07-11 ENCOUNTER — Ambulatory Visit (INDEPENDENT_AMBULATORY_CARE_PROVIDER_SITE_OTHER)

## 2024-07-11 ENCOUNTER — Encounter: Payer: Self-pay | Admitting: Podiatry

## 2024-07-11 VITALS — Ht 62.0 in

## 2024-07-11 DIAGNOSIS — M2031 Hallux varus (acquired), right foot: Secondary | ICD-10-CM | POA: Diagnosis not present

## 2024-07-11 NOTE — Progress Notes (Signed)
   Chief Complaint  Patient presents with   Routine Post Op    Pt is here to f/u pn right foot after surgery she states that its doing better but not perfect.   Subjective:  Patient presents today for follow-up evaluation of right foot pain.  Patient states that she is significantly improved although she does have some very slight tenderness intermittently.  Brief history: triple arthrodesis with first MTP arthrodesis.  DOS: 12/29/2023.  Patient states that she was doing well until 05/25/2024, Friday, when she began to notice increase of pain to the foot.  No history of injury.  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  All incisions are nicely healed.  There is no edema or erythema to the foot.  No concern for infection.  No crepitus with palpation.  Stability of the right foot noted.  There is no tenderness or pain to the surgical sites  Radiographic  Exam RT foot and ankle 07/11/2024:  Orthopedic hardware is stable and intact.  Arthrodesis site stable.    Assessment: 1. s/p triple arthrodesis + first MTP arthrodesis RT. DOS: 12/29/2023  Plan of Care:  -Patient was evaluated.  - From a surgical standpoint patient okay to resume full activity no restrictions.  Recommend good supportive tennis shoes and sneakers -Return to clinic PRN  Thresa EMERSON Sar, DPM [facility]  Dr. Thresa EMERSON Sar, DPM    [address] [place], [place] [address]                Office [phone]  Fax [phone]
